# Patient Record
Sex: Male | Born: 1950
Health system: Southern US, Community
[De-identification: ages and names within clinical notes are randomized; demographics above are authoritative.]

## PROBLEM LIST (undated history)

## (undated) DIAGNOSIS — K5190 Ulcerative colitis, unspecified, without complications: Secondary | ICD-10-CM

## (undated) DIAGNOSIS — F329 Major depressive disorder, single episode, unspecified: Secondary | ICD-10-CM

## (undated) DIAGNOSIS — I10 Essential (primary) hypertension: Secondary | ICD-10-CM

## (undated) DIAGNOSIS — F32A Depression, unspecified: Secondary | ICD-10-CM

## (undated) DIAGNOSIS — I480 Paroxysmal atrial fibrillation: Secondary | ICD-10-CM

## (undated) DIAGNOSIS — E785 Hyperlipidemia, unspecified: Secondary | ICD-10-CM

## (undated) HISTORY — DX: Paroxysmal atrial fibrillation: I48.0

## (undated) HISTORY — DX: Depression, unspecified: F32.A

## (undated) HISTORY — DX: Major depressive disorder, single episode, unspecified: F32.9

## (undated) HISTORY — DX: Hyperlipidemia, unspecified: E78.5

## (undated) HISTORY — DX: Ulcerative colitis, unspecified, without complications: K51.90

## (undated) NOTE — *Deleted (*Deleted)
Pt arrives [facility] from home where pt fell a few days ago. Pt son found pt house covered in blood and feces. Pt not cooperative and did not want

---

## 1999-03-27 ENCOUNTER — Ambulatory Visit (HOSPITAL_COMMUNITY): Admission: RE | Admit: 1999-03-27 | Discharge: 1999-03-27 | Payer: Self-pay | Admitting: *Deleted

## 2001-12-09 ENCOUNTER — Ambulatory Visit (HOSPITAL_COMMUNITY): Admission: RE | Admit: 2001-12-09 | Discharge: 2001-12-09 | Payer: Self-pay | Admitting: *Deleted

## 2003-01-06 ENCOUNTER — Ambulatory Visit (HOSPITAL_BASED_OUTPATIENT_CLINIC_OR_DEPARTMENT_OTHER): Admission: RE | Admit: 2003-01-06 | Discharge: 2003-01-06 | Payer: Self-pay | Admitting: *Deleted

## 2008-02-17 ENCOUNTER — Ambulatory Visit: Payer: Self-pay | Admitting: Internal Medicine

## 2008-11-30 ENCOUNTER — Ambulatory Visit: Payer: Self-pay | Admitting: Otolaryngology

## 2008-11-30 ENCOUNTER — Ambulatory Visit: Payer: Self-pay | Admitting: Internal Medicine

## 2009-08-24 ENCOUNTER — Ambulatory Visit: Payer: Self-pay | Admitting: Internal Medicine

## 2009-08-28 ENCOUNTER — Observation Stay: Payer: Self-pay | Admitting: Internal Medicine

## 2011-03-15 ENCOUNTER — Other Ambulatory Visit: Payer: Self-pay | Admitting: Internal Medicine

## 2011-03-15 DIAGNOSIS — J329 Chronic sinusitis, unspecified: Secondary | ICD-10-CM

## 2011-03-15 MED ORDER — AMOXICILLIN-POT CLAVULANATE 875-125 MG PO TABS: 1.0000 | ORAL_TABLET | Freq: Two times a day (BID) | ORAL | Status: AC

## 2011-05-07 ENCOUNTER — Other Ambulatory Visit: Payer: Self-pay | Admitting: Internal Medicine

## 2011-11-26 ENCOUNTER — Telehealth: Payer: Self-pay | Admitting: Internal Medicine

## 2011-11-26 NOTE — Telephone Encounter (Signed)
Patient called and wanted to know if he needs labs done prior to his physical on Thursday.  Please advise.

## 2011-11-26 NOTE — Telephone Encounter (Signed)
Patient notified.  Lab appt scheduled.

## 2011-11-26 NOTE — Telephone Encounter (Signed)
Fasting lipids, CMET, TSH, PSA.

## 2011-11-27 ENCOUNTER — Other Ambulatory Visit (INDEPENDENT_AMBULATORY_CARE_PROVIDER_SITE_OTHER): Payer: BC Managed Care – PPO | Admitting: *Deleted

## 2011-11-27 DIAGNOSIS — Z13220 Encounter for screening for lipoid disorders: Secondary | ICD-10-CM

## 2011-11-27 DIAGNOSIS — Z1329 Encounter for screening for other suspected endocrine disorder: Secondary | ICD-10-CM

## 2011-11-27 DIAGNOSIS — Z125 Encounter for screening for malignant neoplasm of prostate: Secondary | ICD-10-CM

## 2011-11-27 LAB — COMPREHENSIVE METABOLIC PANEL
AST: 20 U/L (ref 0–37)
Albumin: 4.2 g/dL (ref 3.5–5.2)
Alkaline Phosphatase: 51 U/L (ref 39–117)
BUN: 15 mg/dL (ref 6–23)
CO2: 30 mEq/L (ref 19–32)
Calcium: 9 mg/dL (ref 8.4–10.5)
Glucose, Bld: 82 mg/dL (ref 70–99)
Total Protein: 7.3 g/dL (ref 6.0–8.3)

## 2011-11-27 LAB — LIPID PANEL
HDL: 48.9 mg/dL (ref >39.00)
Total CHOL/HDL Ratio: 3
Triglycerides: 133 mg/dL (ref 0.0–149.0)

## 2011-11-28 LAB — PSA, TOTAL AND FREE: PSA: 3.97 ng/mL (ref <=4.00)

## 2011-11-29 ENCOUNTER — Encounter: Payer: Self-pay | Admitting: Internal Medicine

## 2011-11-29 ENCOUNTER — Ambulatory Visit (INDEPENDENT_AMBULATORY_CARE_PROVIDER_SITE_OTHER): Payer: BC Managed Care – PPO | Admitting: Internal Medicine

## 2011-11-29 VITALS — BP 132/74 | HR 68 | Temp 98.9°F | Resp 14 | Ht 68.0 in | Wt 152.5 lb

## 2011-11-29 DIAGNOSIS — F331 Major depressive disorder, recurrent, moderate: Secondary | ICD-10-CM | POA: Insufficient documentation

## 2011-11-29 DIAGNOSIS — I4891 Unspecified atrial fibrillation: Secondary | ICD-10-CM

## 2011-11-29 DIAGNOSIS — Z125 Encounter for screening for malignant neoplasm of prostate: Secondary | ICD-10-CM

## 2011-11-29 DIAGNOSIS — F17200 Nicotine dependence, unspecified, uncomplicated: Secondary | ICD-10-CM

## 2011-11-29 DIAGNOSIS — G4700 Insomnia, unspecified: Secondary | ICD-10-CM | POA: Insufficient documentation

## 2011-11-29 DIAGNOSIS — Z720 Tobacco use: Secondary | ICD-10-CM

## 2011-11-29 DIAGNOSIS — E785 Hyperlipidemia, unspecified: Secondary | ICD-10-CM

## 2011-11-29 DIAGNOSIS — F329 Major depressive disorder, single episode, unspecified: Secondary | ICD-10-CM

## 2011-11-29 DIAGNOSIS — I480 Paroxysmal atrial fibrillation: Secondary | ICD-10-CM

## 2011-11-29 MED ORDER — ATENOLOL 25 MG PO TABS: 25.0000 mg | ORAL_TABLET | Freq: Every day | ORAL | Status: DC

## 2011-12-02 ENCOUNTER — Encounter: Payer: Self-pay | Admitting: Internal Medicine

## 2011-12-02 DIAGNOSIS — E785 Hyperlipidemia, unspecified: Secondary | ICD-10-CM | POA: Insufficient documentation

## 2011-12-02 DIAGNOSIS — I480 Paroxysmal atrial fibrillation: Secondary | ICD-10-CM | POA: Insufficient documentation

## 2011-12-02 DIAGNOSIS — Z720 Tobacco use: Secondary | ICD-10-CM | POA: Insufficient documentation

## 2011-12-02 DIAGNOSIS — K5190 Ulcerative colitis, unspecified, without complications: Secondary | ICD-10-CM | POA: Insufficient documentation

## 2011-12-02 DIAGNOSIS — Z125 Encounter for screening for malignant neoplasm of prostate: Secondary | ICD-10-CM | POA: Insufficient documentation

## 2011-12-02 NOTE — Assessment & Plan Note (Signed)
Prostate exam was deferred.  PSA was checked and < 4.0

## 2011-12-02 NOTE — Assessment & Plan Note (Signed)
Risks of continued tobacco use were discussed. He is not currently interested in tobacco cessation.  

## 2011-12-02 NOTE — Assessment & Plan Note (Signed)
Recommended low dose once daily atenolol for a trial

## 2011-12-02 NOTE — Addendum Note (Signed)
Addended by: Duncan Dull on: 12/02/2011 06:14 PM   Modules accepted: Level of Service

## 2011-12-02 NOTE — Progress Notes (Signed)
Subjective:     Patient ID: Shane Petersen, male   DOB: 01/14/1951, 61 y.o.   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  He has returned from working in [place] for the last year and has lost about 15 lbs unintentionally which he attributes to the emotional stress of the relocation and the divorce his wife initiated while he was gone. He is not sleeping well despite using lorazepam, and continues to have episodes of palpitations which in the past have been attributed to atrial fibrillation.  However he had an ECHO and EKG done in [place] which were normal. His has normal exercise tolerance, remains physically active and denies chest pain , abdominal pain, night sweats and persistent cough.   Review of Systems  Constitutional: Positive for unexpected weight change. Negative for fever, chills, diaphoresis, activity change, appetite change and fatigue.  HENT: Negative.  Negative for hearing loss, ear pain, nosebleeds, congestion, sore throat, facial swelling, rhinorrhea, sneezing, drooling, mouth sores, trouble swallowing, neck pain, neck stiffness, dental problem, voice change, postnasal drip, sinus pressure, tinnitus and ear discharge.   Eyes: Negative.  Negative for photophobia, pain, discharge, redness, itching and visual disturbance.  Respiratory: Negative.  Negative for apnea, cough, choking, chest tightness, shortness of breath, wheezing and stridor.   Cardiovascular: Positive for palpitations. Negative for chest pain and leg swelling.  Gastrointestinal: Negative.  Negative for nausea, vomiting, abdominal pain, diarrhea, constipation, blood in stool, abdominal distention, anal bleeding and rectal pain.  Genitourinary: Negative.  Negative for dysuria, urgency, frequency, hematuria, flank pain, decreased urine volume, scrotal swelling, difficulty urinating and testicular pain.  Musculoskeletal: Negative.  Negative for myalgias,  back pain, joint swelling, arthralgias and gait problem.  Skin: Negative.  Negative for color change, rash and wound.  Neurological: Negative.  Negative for dizziness, tremors, seizures, syncope, speech difficulty, weakness, light-headedness, numbness and headaches.  Hematological: Negative.   Psychiatric/Behavioral: Positive for dysphoric mood. Negative for suicidal ideas, hallucinations, behavioral problems, confusion, disturbed wake/sleep cycle, decreased concentration and agitation. The patient is nervous/anxious.        Objective:   Physical Exam  Constitutional: He is oriented to person, place, and time.  HENT:  Head: Normocephalic and atraumatic.  Mouth/Throat: Oropharynx is clear and moist.  Eyes: Conjunctivae and EOM are normal.  Neck: Normal range of motion. Neck supple. No JVD present. No thyromegaly present.  Cardiovascular: Normal rate, regular rhythm and normal heart sounds.   Pulmonary/Chest: Effort normal and breath sounds normal. He has no wheezes. He has no rales.  Abdominal: Soft. Bowel sounds are normal. He exhibits no mass. There is no tenderness. There is no rebound.  Genitourinary:       deferred  Musculoskeletal: Normal range of motion. He exhibits no edema.  Neurological: He is alert and oriented to person, place, and time.  Skin: Skin is warm and dry.  Psychiatric: He has a normal mood and affect.       Insomnia continue lorazepam and sonata for now.  We discussed trial of SSRI for anxiety vs bedtime trazodone but he would like to wait until he discusses with Dr. Manson Passey.   Episodic atrial fibrillation Recommended low dose once daily atenolol for a trial   Other and unspecified hyperlipidemia Well controlled on current medications.  LDL is < 70 and LFTS are normal. No changes today.  Depression He has had prior trials of Effexor and lexapro which he  did not tolerate well due to various side effects.  He has concurrent anxiety, making Paxil and appropriate  choice, but patient defers to Dr. Manson Passey.   Tobacco abuse, episodic Risks of continued tobacco use were discussed. He is not currently interested in tobacco cessation.    Updated Medication List Outpatient Encounter Prescriptions as of 11/29/2011  Medication Sig Dispense Refill  . atorvastatin (LIPITOR) 10 MG tablet Take 10 mg by mouth daily.      Marland Kitchen LORazepam (ATIVAN) 0.5 MG tablet Take 0.5 mg by mouth every 8 (eight) hours.      . Multiple Vitamin (MULTIVITAMIN) tablet Take 1 tablet by mouth daily.      . ranitidine (ZANTAC) 150 MG tablet Take 150 mg by mouth 2 (two) times daily.      . zaleplon (SONATA) 10 MG capsule Take 10 mg by mouth at bedtime.      Marland Kitchen atenolol (TENORMIN) 25 MG tablet Take 1 tablet (25 mg total) by mouth daily.  30 tablet  3

## 2011-12-02 NOTE — Assessment & Plan Note (Signed)
He has had prior trials of Effexor and lexapro which he did not tolerate well due to various side effects.  He has concurrent anxiety, making Paxil and appropriate choice, but patient defers to Dr. Manson Passey.

## 2011-12-02 NOTE — Assessment & Plan Note (Signed)
Well controlled on current medications.  LDL is < 70 and LFTS are normal. No changes today.

## 2011-12-02 NOTE — Assessment & Plan Note (Addendum)
continue lorazepam and sonata for now.  We discussed trial of SSRI for anxiety vs bedtime trazodone but he would like to wait until he discusses with Dr. Manson Passey.

## 2012-01-08 ENCOUNTER — Other Ambulatory Visit: Payer: Self-pay | Admitting: Internal Medicine

## 2012-01-09 MED ORDER — LORAZEPAM 0.5 MG PO TABS: 0.5000 mg | ORAL_TABLET | Freq: Three times a day (TID) | ORAL | Status: DC

## 2012-01-09 MED ORDER — ATENOLOL 25 MG PO TABS: 25.0000 mg | ORAL_TABLET | Freq: Every day | ORAL | Status: DC

## 2012-01-09 MED ORDER — ZALEPLON 10 MG PO CAPS: 10.0000 mg | ORAL_CAPSULE | Freq: Every day | ORAL | Status: DC

## 2012-01-17 ENCOUNTER — Other Ambulatory Visit: Payer: Self-pay | Admitting: Internal Medicine

## 2012-01-17 MED ORDER — ZALEPLON 10 MG PO CAPS: 10.0000 mg | ORAL_CAPSULE | Freq: Every day | ORAL | Status: DC

## 2012-01-17 MED ORDER — ATENOLOL 25 MG PO TABS: 25.0000 mg | ORAL_TABLET | Freq: Every day | ORAL | Status: DC

## 2012-01-28 ENCOUNTER — Other Ambulatory Visit: Payer: Self-pay | Admitting: Internal Medicine

## 2012-01-28 MED ORDER — DOXYCYCLINE HYCLATE 100 MG PO TABS: 100.0000 mg | ORAL_TABLET | Freq: Two times a day (BID) | ORAL | Status: AC

## 2012-03-31 ENCOUNTER — Other Ambulatory Visit: Payer: Self-pay | Admitting: *Deleted

## 2012-03-31 MED ORDER — ATORVASTATIN CALCIUM 10 MG PO TABS: 10.0000 mg | ORAL_TABLET | Freq: Every day | ORAL | Status: DC

## 2012-03-31 NOTE — Telephone Encounter (Signed)
R'cd fax from [facility] for refill of generic Lipitor.

## 2012-05-14 ENCOUNTER — Other Ambulatory Visit: Payer: Self-pay | Admitting: Internal Medicine

## 2012-05-14 MED ORDER — ATENOLOL 25 MG PO TABS: 25.0000 mg | ORAL_TABLET | Freq: Two times a day (BID) | ORAL | Status: DC

## 2013-01-27 ENCOUNTER — Telehealth: Payer: Self-pay | Admitting: *Deleted

## 2013-01-27 NOTE — Telephone Encounter (Signed)
Refill Request  Fluticasone 50 mcg/Inh mose AER   #16  Use one to two sprays in each nostril once a day

## 2013-01-28 MED ORDER — FLUTICASONE PROPIONATE 50 MCG/ACT NA SUSP: 2.0000 | Freq: Every day | NASAL | Status: DC

## 2013-01-28 NOTE — Telephone Encounter (Signed)
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

## 2013-01-28 NOTE — Telephone Encounter (Signed)
Patient has not had OV 6/13 please advise as to refill on medication I see no reference to patient being on this medication.

## 2013-01-28 NOTE — Addendum Note (Signed)
Addended by: Sherlene Shams on: 01/28/2013 05:00 PM   Modules accepted: Orders

## 2013-03-16 ENCOUNTER — Other Ambulatory Visit: Payer: Self-pay | Admitting: Internal Medicine

## 2013-03-18 ENCOUNTER — Other Ambulatory Visit: Payer: Self-pay | Admitting: *Deleted

## 2013-03-18 NOTE — Telephone Encounter (Signed)
Refill denied.  Please ask Dr Barnabas Lister to come by for fasting labs asap .  He has not had labs since June 2013.

## 2013-03-23 ENCOUNTER — Other Ambulatory Visit: Payer: Self-pay | Admitting: *Deleted

## 2013-03-25 ENCOUNTER — Other Ambulatory Visit: Payer: Self-pay | Admitting: *Deleted

## 2013-03-26 MED ORDER — ATORVASTATIN CALCIUM 10 MG PO TABS: 10.0000 mg | ORAL_TABLET | Freq: Every day | ORAL | Status: DC

## 2013-03-26 NOTE — Telephone Encounter (Signed)
Eprescribed.

## 2013-06-12 ENCOUNTER — Other Ambulatory Visit: Payer: Self-pay | Admitting: Internal Medicine

## 2013-06-15 NOTE — Telephone Encounter (Signed)
Patient has not been seen by you since 11/29/2011. Requesting refill on medication (Atenolol).

## 2013-06-16 NOTE — Telephone Encounter (Signed)
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

## 2013-07-25 ENCOUNTER — Other Ambulatory Visit: Payer: Self-pay | Admitting: Internal Medicine

## 2013-07-25 NOTE — Telephone Encounter (Signed)
Okay to refill? Last seen in June 2013.

## 2013-07-27 NOTE — Telephone Encounter (Signed)
He has not had lfts in over a year so i cannot refill it,. 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

## 2013-07-30 ENCOUNTER — Other Ambulatory Visit: Payer: Self-pay | Admitting: *Deleted

## 2013-07-31 ENCOUNTER — Other Ambulatory Visit: Payer: Self-pay | Admitting: *Deleted

## 2013-07-31 NOTE — Telephone Encounter (Signed)
Patient stated he is having insurance problems and cannot come in for a couple of months and would like to know if you will continue to fill until he can get insurance problems corrected.

## 2013-07-31 NOTE — Telephone Encounter (Signed)
Ok to refill,  Authorized in epic 

## 2013-08-20 ENCOUNTER — Other Ambulatory Visit: Payer: Self-pay | Admitting: Internal Medicine

## 2013-08-20 NOTE — Telephone Encounter (Signed)
Appt 09/18/13 

## 2013-08-28 ENCOUNTER — Encounter: Payer: BC Managed Care – PPO | Admitting: Internal Medicine

## 2013-08-31 ENCOUNTER — Other Ambulatory Visit: Payer: Self-pay | Admitting: Internal Medicine

## 2013-08-31 NOTE — Telephone Encounter (Signed)
Has an appt on April 10th... Okay to refill?

## 2013-09-18 ENCOUNTER — Encounter: Payer: Self-pay | Admitting: Internal Medicine

## 2013-09-18 ENCOUNTER — Ambulatory Visit (INDEPENDENT_AMBULATORY_CARE_PROVIDER_SITE_OTHER): Payer: BC Managed Care – PPO | Admitting: Internal Medicine

## 2013-09-18 VITALS — BP 138/76 | HR 88 | Temp 98.7°F | Resp 16 | Ht 69.5 in | Wt 180.5 lb

## 2013-09-18 DIAGNOSIS — Z2911 Encounter for prophylactic immunotherapy for respiratory syncytial virus (RSV): Secondary | ICD-10-CM

## 2013-09-18 DIAGNOSIS — R5383 Other fatigue: Secondary | ICD-10-CM

## 2013-09-18 DIAGNOSIS — R5381 Other malaise: Secondary | ICD-10-CM

## 2013-09-18 DIAGNOSIS — F329 Major depressive disorder, single episode, unspecified: Secondary | ICD-10-CM

## 2013-09-18 DIAGNOSIS — I480 Paroxysmal atrial fibrillation: Secondary | ICD-10-CM

## 2013-09-18 DIAGNOSIS — E785 Hyperlipidemia, unspecified: Secondary | ICD-10-CM

## 2013-09-18 DIAGNOSIS — Z113 Encounter for screening for infections with a predominantly sexual mode of transmission: Secondary | ICD-10-CM

## 2013-09-18 DIAGNOSIS — Z23 Encounter for immunization: Secondary | ICD-10-CM

## 2013-09-18 DIAGNOSIS — F32A Depression, unspecified: Secondary | ICD-10-CM

## 2013-09-18 DIAGNOSIS — Z0000 Encounter for general adult medical examination without abnormal findings: Secondary | ICD-10-CM

## 2013-09-18 DIAGNOSIS — Z125 Encounter for screening for malignant neoplasm of prostate: Secondary | ICD-10-CM

## 2013-09-18 DIAGNOSIS — F3289 Other specified depressive episodes: Secondary | ICD-10-CM

## 2013-09-18 DIAGNOSIS — K5190 Ulcerative colitis, unspecified, without complications: Secondary | ICD-10-CM

## 2013-09-18 DIAGNOSIS — I4891 Unspecified atrial fibrillation: Secondary | ICD-10-CM

## 2013-09-18 LAB — CBC WITH DIFFERENTIAL/PLATELET
Basophils Absolute: 0.1 10*3/uL (ref 0.0–0.1)
Basophils Relative: 1 % (ref 0–1)
Eosinophils Absolute: 0.2 10*3/uL (ref 0.0–0.7)
Eosinophils Relative: 2 % (ref 0–5)
HEMATOCRIT: 44.7 % (ref 39.0–52.0)
HEMOGLOBIN: 15.1 g/dL (ref 13.0–17.0)
Lymphocytes Relative: 23 % (ref 12–46)
Lymphs Abs: 2 10*3/uL (ref 0.7–4.0)
MCH: 30.8 pg (ref 26.0–34.0)
MCHC: 33.8 g/dL (ref 30.0–36.0)
MCV: 91.2 fL (ref 78.0–100.0)
MONOS PCT: 11 % (ref 3–12)
Monocytes Absolute: 1 10*3/uL (ref 0.1–1.0)
NEUTROS ABS: 5.5 10*3/uL (ref 1.7–7.7)
Neutrophils Relative %: 63 % (ref 43–77)
Platelets: 259 10*3/uL (ref 150–400)
RBC: 4.9 MIL/uL (ref 4.22–5.81)
RDW: 13.6 % (ref 11.5–15.5)
WBC: 8.8 10*3/uL (ref 4.0–10.5)

## 2013-09-18 LAB — COMPREHENSIVE METABOLIC PANEL
ALT: 35 U/L (ref 0–53)
AST: 25 U/L (ref 0–37)
Albumin: 4.2 g/dL (ref 3.5–5.2)
Alkaline Phosphatase: 53 U/L (ref 39–117)
BILIRUBIN TOTAL: 0.6 mg/dL (ref 0.2–1.2)
BUN: 11 mg/dL (ref 6–23)
CHLORIDE: 100 meq/L (ref 96–112)
CO2: 30 meq/L (ref 19–32)
Calcium: 9.6 mg/dL (ref 8.4–10.5)
Creat: 1.04 mg/dL (ref 0.50–1.35)
GLUCOSE: 92 mg/dL (ref 70–99)
POTASSIUM: 4.4 meq/L (ref 3.5–5.3)
Sodium: 140 mEq/L (ref 135–145)
Total Protein: 6.8 g/dL (ref 6.0–8.3)

## 2013-09-18 LAB — LDL CHOLESTEROL, DIRECT: LDL DIRECT: 98 mg/dL

## 2013-09-18 MED ORDER — AZELASTINE-FLUTICASONE 137-50 MCG/ACT NA SUSP: NASAL | Status: DC

## 2013-09-18 MED ORDER — ATENOLOL 25 MG PO TABS: 75.0000 mg | ORAL_TABLET | Freq: Every day | ORAL | Status: DC

## 2013-09-18 MED ORDER — SILDENAFIL CITRATE 50 MG PO TABS: 50.0000 mg | ORAL_TABLET | Freq: Every day | ORAL | Status: DC | PRN

## 2013-09-18 NOTE — Patient Instructions (Signed)
It was good to see you Shane Petersen  Your labs should be available in a few days on Mychart ,  I'll review them and send them to you..Marland Kitchen

## 2013-09-18 NOTE — Progress Notes (Signed)
Patient ID: Shane Petersen, male   DOB: Sep 14, 1950, 63 y.o.   MRN: [id number]  The patient is here for annual  wellness examination and management of other chronic and acute problems.  Annual non urologic exam.  Last seen 2012 .  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  During his absence he suffered  from severe depression, divorce and bankruptcy.  He is seeing a long time trusted psychiatrist and is currently stable ,  Not suicidal .  Has begun seeing a woma he met on a walking trail and enjoying the companionship.  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  Miserable in his current arrangement but compelled to continue current arrangement until something else is vailable.  Has gained 3 lbs since last visit.     The risk factors are reflected in the social history.  The roster of all physicians providing medical care to patient - is listed in the Snapshot section of the chart.  Activities of daily living:  The patient is 100% independent in all ADLs: dressing, toileting, feeding as well as independent mobility  Home safety : The patient has smoke detectors in the home. They wear seatbelts.  There are no firearms at home. There is no violence in the home.   There is no risks for hepatitis, STDs or HIV. There is no   history of blood transfusion. They have no travel history to infectious disease endemic areas of the world.  The patient has seen their dentist in the last six month. They have seen their eye doctor in the last year. They admit to slight hearing difficulty with regard to whispered voices and some television programs.  They have deferred audiologic testing in the last year.  They do not  have excessive sun exposure. Discussed the need for sun protection: hats, long sleeves and use of sunscreen if there is significant sun exposure.   Diet: the importance of a healthy diet is discussed. They do have a  healthy diet.  The benefits of regular aerobic exercise were discussed. She walks 4 times per week ,  20 minutes.    The following portions of the patient's history were reviewed and updated as appropriate: allergies, current medications, past family history, past medical history,  past surgical history, past social history  and problem list.  Visual acuity was not assessed per patient preference since she has regular follow up with her ophthalmologist. Hearing and body mass index were assessed and reviewed.   During the course of the visit the patient was educated and counseled about appropriate screening and preventive services including : fall prevention , diabetes screening, nutrition counseling, colorectal cancer screening, and recommended immunizations.    Objective:   BP 138/76  Pulse 88  Temp(Src) 98.7 F (37.1 C) (Oral)  Resp 16  Ht 5' 9.5" (1.765 m)  Wt 180 lb 8 oz (81.874 kg)  BMI 26.28 kg/m2  SpO2 98%  General Appearance:    Alert, cooperative, no distress, appears stated age  Head:    Normocephalic, without obvious abnormality, atraumatic  Eyes:    PERRL, conjunctiva/corneas clear, EOM's intact, fundi    benign, both eyes       Ears:    Normal TM's and external ear canals, both ears  Nose:   Nares normal, septum midline, mucosa normal, no drainage   or sinus tenderness  Throat:   Lips, mucosa, and tongue  normal; teeth and gums normal  Neck:   Supple, symmetrical, trachea midline, no adenopathy;       thyroid:  No enlargement/tenderness/nodules; no carotid   bruit or JVD  Back:     Symmetric, no curvature, ROM normal, no CVA tenderness  Lungs:     Clear to auscultation bilaterally, respirations unlabored  Chest wall:    No tenderness or deformity  Heart:    Regular rate and rhythm, S1 and S2 normal, no murmur, rub   or gallop  Abdomen:     Soft, non-tender, bowel sounds active all four quadrants,    no masses, no organomegaly  Extremities:   Extremities normal,  atraumatic, no cyanosis or edema  Pulses:   2+ and symmetric all extremities  Skin:   Skin color, texture, turgor normal, tatooes noted on left upper arm   Lymph nodes:   Cervical, supraclavicular, and axillary nodes normal  Neurologic:   CNII-XII intact. Normal strength, sensation and reflexes      throughout    Assessment and plan:  Depression Currently stable,  Managed by psychiatris tin [place]   Paroxysmal atrial fibrillation continue atenolol bid.   Special screening for malignant neoplasm of prostate PSA without prostate exam done today   Ulcerative colitis He is overdue for colonoscopy follow up but has deferred for now   Other and unspecified hyperlipidemia Tolerating statin therapy.   Liver enzymes are normal , no changes today.  Lab Results  Component Value Date   CHOL 135 11/27/2011   HDL 48.90 11/27/2011   LDLCALC 60 11/27/2011   LDLDIRECT 98 09/18/2013   TRIG 133.0 11/27/2011   CHOLHDL 3 11/27/2011   Lab Results  Component Value Date   ALT 35 09/18/2013   AST 25 09/18/2013   ALKPHOS 53 09/18/2013   BILITOT 0.6 09/18/2013     Encounter for preventive health examination Annual male exam was done excluding testicular and prostate exam. PSA is pending .  Colon ca screening was reviewed and he has deferred screening for now.  Lab Results  Component Value Date   PSA 1.15 09/18/2013   PSA 3.97 11/27/2011        Updated Medication List Outpatient Encounter Prescriptions as of 09/18/2013  Medication Sig  . ARIPiprazole (ABILIFY) 2 MG tablet Take 2 mg by mouth daily.  Marland Kitchen aspirin 81 MG tablet Take 81 mg by mouth daily.  Marland Kitchen atenolol (TENORMIN) 25 MG tablet Take 3 tablets (75 mg total) by mouth daily.  Marland Kitchen atorvastatin (LIPITOR) 20 MG tablet TAKE 1 TABLET EVERY DAY  . Eszopiclone 3 MG TABS Take 3 mg by mouth at bedtime and may repeat dose one time if needed.  . fluticasone (FLONASE) 50 MCG/ACT nasal spray Place 2 sprays into the nose daily.  . Melatonin 3 MG CAPS  Take 1 capsule by mouth at bedtime and may repeat dose one time if needed.  . mirtazapine (REMERON) 15 MG tablet Take 15 mg by mouth daily.  . Multiple Vitamin (MULTIVITAMIN) tablet Take 1 tablet by mouth daily.  . zaleplon (SONATA) 10 MG capsule Take 1 capsule (10 mg total) by mouth at bedtime.  . [DISCONTINUED] atenolol (TENORMIN) 25 MG tablet Take 75 mg by mouth daily.  Marland Kitchen atorvastatin (LIPITOR) 10 MG tablet Take 20 mg by mouth daily.  . Azelastine-Fluticasone (DYMISTA) 137-50 MCG/ACT SUSP 2 squirts In each nostril daily  . sildenafil (VIAGRA) 50 MG tablet Take 1 tablet (50 mg total) by mouth daily as needed for erectile dysfunction.  . [  DISCONTINUED] atenolol (TENORMIN) 25 MG tablet Take 1.5 tablets (37.5 mg total) by mouth 2 (two) times daily.  . [DISCONTINUED] atorvastatin (LIPITOR) 10 MG tablet Take 1 tablet (10 mg total) by mouth daily.  . [DISCONTINUED] LORazepam (ATIVAN) 0.5 MG tablet Take 1 tablet (0.5 mg total) by mouth every 8 (eight) hours.  . [DISCONTINUED] ranitidine (ZANTAC) 150 MG tablet Take 150 mg by mouth 2 (two) times daily.

## 2013-09-19 LAB — PSA: PSA: 1.15 ng/mL (ref <=4.00)

## 2013-09-19 LAB — HEPATITIS C ANTIBODY: HCV Ab: NEGATIVE

## 2013-09-19 LAB — TSH: TSH: 0.792 u[IU]/mL (ref 0.350–4.500)

## 2013-09-20 DIAGNOSIS — Z0000 Encounter for general adult medical examination without abnormal findings: Secondary | ICD-10-CM | POA: Insufficient documentation

## 2013-09-20 NOTE — Assessment & Plan Note (Signed)
Tolerating statin therapy.   Liver enzymes are normal , no changes today.  Lab Results  Component Value Date   CHOL 135 11/27/2011   HDL 48.90 11/27/2011   LDLCALC 60 11/27/2011   LDLDIRECT 98 09/18/2013   TRIG 133.0 11/27/2011   CHOLHDL 3 11/27/2011   Lab Results  Component Value Date   ALT 35 09/18/2013   AST 25 09/18/2013   ALKPHOS 53 09/18/2013   BILITOT 0.6 09/18/2013

## 2013-09-20 NOTE — Assessment & Plan Note (Signed)
PSA without prostate exam done today

## 2013-09-20 NOTE — Assessment & Plan Note (Signed)
Annual male exam was done excluding testicular and prostate exam. PSA is pending .  Colon ca screening was reviewed and he has deferred screening for now.  Lab Results  Component Value Date   PSA 1.15 09/18/2013   PSA 3.97 11/27/2011

## 2013-09-20 NOTE — Assessment & Plan Note (Signed)
continue atenolol bid.

## 2013-09-20 NOTE — Assessment & Plan Note (Signed)
Currently stable,  [facility]

## 2013-09-20 NOTE — Assessment & Plan Note (Signed)
He is overdue for colonoscopy follow up but has deferred for now

## 2013-09-21 ENCOUNTER — Telehealth: Payer: Self-pay | Admitting: Internal Medicine

## 2013-09-21 ENCOUNTER — Encounter: Payer: Self-pay | Admitting: *Deleted

## 2013-09-21 NOTE — Telephone Encounter (Signed)
Relevant patient education mailed to patient.  

## 2013-10-16 ENCOUNTER — Other Ambulatory Visit: Payer: Self-pay | Admitting: Internal Medicine

## 2014-01-29 ENCOUNTER — Telehealth: Payer: Self-pay

## 2014-01-29 DIAGNOSIS — Z135 Encounter for screening for eye and ear disorders: Secondary | ICD-10-CM

## 2014-01-29 DIAGNOSIS — H524 Presbyopia: Secondary | ICD-10-CM

## 2014-01-29 NOTE — Telephone Encounter (Signed)
The patient is hoping to obtain a referral for Shane Petersen for his yearly Petersen exam.

## 2014-01-29 NOTE — Telephone Encounter (Signed)
Referral is in process as requested for Dr Barnabas [facility]ister[place] to [facility]

## 2014-04-09 ENCOUNTER — Other Ambulatory Visit: Payer: Self-pay | Admitting: Internal Medicine

## 2014-04-19 ENCOUNTER — Other Ambulatory Visit: Payer: Self-pay | Admitting: Internal Medicine

## 2014-08-20 ENCOUNTER — Other Ambulatory Visit: Payer: Self-pay | Admitting: *Deleted

## 2014-08-20 ENCOUNTER — Other Ambulatory Visit: Payer: Self-pay | Admitting: Internal Medicine

## 2014-08-20 MED ORDER — ATENOLOL 25 MG PO TABS: ORAL_TABLET | ORAL | Status: DC

## 2014-09-24 ENCOUNTER — Ambulatory Visit (INDEPENDENT_AMBULATORY_CARE_PROVIDER_SITE_OTHER): Payer: Commercial Managed Care - HMO | Admitting: Internal Medicine

## 2014-09-24 ENCOUNTER — Encounter: Payer: Self-pay | Admitting: Internal Medicine

## 2014-09-24 VITALS — BP 120/80 | HR 59 | Temp 98.9°F | Ht 68.8 in | Wt 172.8 lb

## 2014-09-24 DIAGNOSIS — R5383 Other fatigue: Secondary | ICD-10-CM

## 2014-09-24 DIAGNOSIS — E559 Vitamin D deficiency, unspecified: Secondary | ICD-10-CM

## 2014-09-24 DIAGNOSIS — F329 Major depressive disorder, single episode, unspecified: Secondary | ICD-10-CM

## 2014-09-24 DIAGNOSIS — I480 Paroxysmal atrial fibrillation: Secondary | ICD-10-CM

## 2014-09-24 DIAGNOSIS — Z125 Encounter for screening for malignant neoplasm of prostate: Secondary | ICD-10-CM

## 2014-09-24 DIAGNOSIS — E785 Hyperlipidemia, unspecified: Secondary | ICD-10-CM

## 2014-09-24 DIAGNOSIS — Z0000 Encounter for general adult medical examination without abnormal findings: Secondary | ICD-10-CM | POA: Diagnosis not present

## 2014-09-24 DIAGNOSIS — F32A Depression, unspecified: Secondary | ICD-10-CM

## 2014-09-24 LAB — CBC WITH DIFFERENTIAL/PLATELET
BASOS PCT: 0.4 % (ref 0.0–3.0)
Basophils Absolute: 0 10*3/uL (ref 0.0–0.1)
Eosinophils Absolute: 0.1 10*3/uL (ref 0.0–0.7)
Eosinophils Relative: 1.6 % (ref 0.0–5.0)
HCT: 46.8 % (ref 39.0–52.0)
Hemoglobin: 15.9 g/dL (ref 13.0–17.0)
LYMPHS ABS: 1.8 10*3/uL (ref 0.7–4.0)
Lymphocytes Relative: 22.4 % (ref 12.0–46.0)
MCHC: 34 g/dL (ref 30.0–36.0)
MCV: 90.8 fl (ref 78.0–100.0)
MONO ABS: 0.6 10*3/uL (ref 0.1–1.0)
Monocytes Relative: 7.3 % (ref 3.0–12.0)
Neutro Abs: 5.4 10*3/uL (ref 1.4–7.7)
Neutrophils Relative %: 68.3 % (ref 43.0–77.0)
Platelets: 265 10*3/uL (ref 150.0–400.0)
RBC: 5.16 Mil/uL (ref 4.22–5.81)
RDW: 13.5 % (ref 11.5–15.5)
WBC: 7.9 10*3/uL (ref 4.0–10.5)

## 2014-09-24 LAB — LIPID PANEL
CHOL/HDL RATIO: 4
Cholesterol: 178 mg/dL (ref 0–200)
HDL: 40.5 mg/dL (ref >39.00)
NonHDL: 137.5
TRIGLYCERIDES: 297 mg/dL — AB (ref 0.0–149.0)
VLDL: 59.4 mg/dL — ABNORMAL HIGH (ref 0.0–40.0)

## 2014-09-24 LAB — COMPREHENSIVE METABOLIC PANEL
ALBUMIN: 4.2 g/dL (ref 3.5–5.2)
ALT: 25 U/L (ref 0–53)
AST: 20 U/L (ref 0–37)
Alkaline Phosphatase: 53 U/L (ref 39–117)
BUN: 12 mg/dL (ref 6–23)
CALCIUM: 9.8 mg/dL (ref 8.4–10.5)
CHLORIDE: 101 meq/L (ref 96–112)
CO2: 30 mEq/L (ref 19–32)
CREATININE: 0.94 mg/dL (ref 0.40–1.50)
GFR: 85.38 mL/min (ref >60.00)
Glucose, Bld: 118 mg/dL — ABNORMAL HIGH (ref 70–99)
POTASSIUM: 4.1 meq/L (ref 3.5–5.1)
Sodium: 138 mEq/L (ref 135–145)
Total Bilirubin: 0.8 mg/dL (ref 0.2–1.2)
Total Protein: 6.9 g/dL (ref 6.0–8.3)

## 2014-09-24 LAB — TSH: TSH: 0.71 u[IU]/mL (ref 0.35–4.50)

## 2014-09-24 LAB — LDL CHOLESTEROL, DIRECT: Direct LDL: 100 mg/dL

## 2014-09-24 LAB — PSA, MEDICARE: PSA: 1.14 ng/ml (ref 0.10–4.00)

## 2014-09-24 LAB — VITAMIN D 25 HYDROXY (VIT D DEFICIENCY, FRACTURES): VITD: 44.05 ng/mL (ref 30.00–100.00)

## 2014-09-24 NOTE — Progress Notes (Signed)
Patient ID: Shane Petersen, male   DOB: 08/11/1950, 64 y.o.   MRN: [id number]   The patient is here for his annual male preventive examination and management of other chronic and acute problems. He has been [place] in [place] , Dr.Fred Raj JanusIrons, for [employer] Energymanamgement of persistent depression with insomnia ,  Taking abilify,  mirtazipine , lunesta and sonata .     The risk factors are reflected in the social history.  The roster of all physicians providing medical care to patient - is listed in the Snapshot section of the chart.   Home safety : The patient has smoke detectors in the home. He wears seatbelts.  There are registered  firearms at home. There is no violence in the home.   There is no risks for hepatitis, STDs or HIV. There is no   history of blood transfusion. There is no travel history to infectious disease endemic areas of the world.  The patient has seen their dentist in the last six month and  their eye doctor in the last year.  They do not  have excessive sun exposure. They have seen a dermatoloigist in the last year. Discussed the need for sun protection: hats, long sleeves and use of sunscreen if there is significant sun exposure.   Diet: the importance of a healthy diet is discussed. They do have a healthy diet.  The benefits of regular aerobic exercise were discussed. He exercises a minimum of 30 minutes  5 days per week.  Depression screen: there are no signs or vegative symptoms of depression- irritability, change in appetite, anhedonia, sadness/tearfullness.  The following portions of the patient's history were reviewed and updated as appropriate: allergies, current medications, past family history, past medical history,  past surgical history, past social history  and problem list.  Visual acuity was not assessed per patient preference since he has regular follow up with his ophthalmologist. Hearing and body mass index were assessed and reviewed.   During  the course of the visit the patient was educated and counseled about appropriate screening and preventive services including :  nutrition counseling, colorectal cancer screening, and recommended immunizations.    Review of systems:  Patient denies headache, fevers, malaise, unintentional weight loss, skin rash, eye pain, sinus congestion and sinus pain, sore throat, dysphagia,  hemoptysis , cough, dyspnea, wheezing, chest pain, palpitations, orthopnea, edema, abdominal pain, nausea, melena, diarrhea, constipation, flank pain, dysuria, hematuria, urinary  Frequency, nocturia, numbness, tingling, seizures,  Focal weakness, Loss of consciousness,  Tremor, .   Objective:  BP 120/80 mmHg  Pulse 59  Temp(Src) 98.9 F (37.2 C) (Oral)  Ht 5' 8.8" (1.748 m)  Wt 172 lb 12 oz (78.359 kg)  BMI 25.65 kg/m2  SpO2 96%  General appearance: alert, cooperative and appears stated age Ears: normal TM's and external ear canals both ears Throat: lips, mucosa, and tongue normal; teeth and gums normal Neck: no adenopathy, no carotid bruit, supple, symmetrical, trachea midline and thyroid not enlarged, symmetric, no tenderness/mass/nodules Back: symmetric, no curvature. ROM normal. No CVA tenderness. Lungs: clear to auscultation bilaterally Heart: regular rate and rhythm, S1, S2 normal, no murmur, click, rub or gallop Abdomen: soft, non-tender; bowel sounds normal; no masses,  no organomegaly Pulses: 2+ and symmetric Skin: Skin color, texture, turgor normal. No rashes or lesions Lymph nodes: Cervical, supraclavicular, and axillary nodes normal. Psychiatric:  Affect flat , makes good eye contact.  He has occasional Suicidal thoughts but no plan,  His faith  prevents him from acting.   Assessment and Plan:  Problem List Items Addressed This Visit      Unprioritized   Depression    Currently managed by Dr [facility]lee [facility] at [facility].  With mirtazipine,  Abilify,  Sonata and lunesta.  He has had suicidal thoughts,  But  no plan and states that his Ephri[employer] Knuckles faith prevents him from acting on his thoughts.       Hyperlipidemia LDL goal <130    He is not fasting and cannot return for fasting labs. LDL  Is at goal on current medications. He has no side effects and liver enzymes are normal. No changes today   Lab Results  Component Value Date   CHOL 178 09/24/2014   HDL 40.50 09/24/2014   LDLCALC 60 11/27/2011   LDLDIRECT 100.0 09/24/2014   TRIG 297.0* 09/24/2014   CHOLHDL 4 09/24/2014   Lab Results  Component Value Date   ALT 25 09/24/2014   AST 20 09/24/2014   ALKPHOS 53 09/24/2014   BILITOT 0.8 09/24/2014         Paroxysmal atrial fibrillation    He has had no recent episodes      Encounter for preventive health examination    Annual wellness  exam was done as well as a comprehensive physical exam and management of acute and chronic conditions .  During the course of the visit the patient was educated and counseled about appropriate screening and preventive services including :  diabetes screening, lipid analysis with projected  10 year  risk for CAD , nutrition counseling, colorectal cancer screening, and recommended immunizations.  Printed recommendations for health maintenance screenings was given.        Other Visit Diagnoses    Hyperlipidemia    -  Primary    Relevant Orders    Lipid panel (Completed)    LDL cholesterol, direct (Completed)    Vitamin D deficiency        Relevant Orders    Vit D  25 hydroxy (rtn osteoporosis monitoring) (Completed)    Other fatigue        Relevant Orders    CBC with Differential/Platelet (Completed)    Comprehensive metabolic panel (Completed)    TSH (Completed)    Prostate cancer screening        Relevant Orders    PSA, Medicare (Completed)

## 2014-09-24 NOTE — Patient Instructions (Signed)
Shane Petersen,  My hone number is still [phone]  I'll send you your results via MyChart  I'll call you about having dinner some Thursday in May after I [place] [facility]   Health Maintenance A healthy lifestyle and preventative care can promote health and wellness.  Maintain regular health, dental, and eye exams.  Eat a healthy diet. Foods like vegetables, fruits, whole grains, low-fat dairy products, and lean protein foods contain the nutrients you need and are low in calories. Decrease your intake of foods high in solid fats, added sugars, and salt. Get information about a proper diet from your health care provider, if necessary.  Regular physical exercise is one of the most important things you can do for your health. Most adults should get at least 150 minutes of moderate-intensity exercise (any activity that increases your heart rate and causes you to sweat) each week. In addition, most adults need muscle-strengthening exercises on 2 or more days a week.   Maintain a healthy weight. The body mass index (BMI) is a screening tool to identify possible weight problems. It provides an estimate of body fat based on height and weight. Your health care provider can find your BMI and can help you achieve or maintain a healthy weight. For males 20 years and older:  A BMI below 18.5 is considered underweight.  A BMI of 18.5 to 24.9 is normal.  A BMI of 25 to 29.9 is considered overweight.  A BMI of 30 and above is considered obese.  Maintain normal blood lipids and cholesterol by exercising and minimizing your intake of saturated fat. Eat a balanced diet with plenty of fruits and vegetables. Blood tests for lipids and cholesterol should begin at age 66 and be repeated every 5 years. If your lipid or cholesterol levels are high, you are over age 66, or you are at high risk for heart disease, you may need your cholesterol levels checked more frequently.Ongoing high lipid and cholesterol  levels should be treated with medicines if diet and exercise are not working.  If you smoke, find out from your health care provider how to quit. If you do not use tobacco, do not start.  Lung cancer screening is recommended for adults aged 55-80 years who are at high risk for developing lung cancer because of a history of smoking. A yearly low-dose CT scan of the lungs is recommended for people who have at least a 30-pack-year history of smoking and are current smokers or have quit within the past 15 years. A pack year of smoking is smoking an average of 1 pack of cigarettes a day for 1 year (for example, a 30-pack-year history of smoking could mean smoking 1 pack a day for 30 years or 2 packs a day for 15 years). Yearly screening should continue until the smoker has stopped smoking for at least 15 years. Yearly screening should be stopped for people who develop a health problem that would prevent them from having lung cancer treatment.  If you choose to drink alcohol, do not have more than 2 drinks per day. One drink is considered to be 12 oz (360 mL) of beer, 5 oz (150 mL) of wine, or 1.5 oz (45 mL) of liquor.  Avoid the use of street drugs. Do not share needles with anyone. Ask for help if you need support or instructions about stopping the use of drugs.  High blood pressure causes heart disease and increases the risk of stroke.  Blood pressure should be checked at least every 1-2 years. Ongoing high blood pressure should be treated with medicines if weight loss and exercise are not effective.  If you are 46-35 years old, ask your health care provider if you should take aspirin to prevent heart disease.  Diabetes screening involves taking a blood sample to check your fasting blood sugar level. This should be done once every 3 years after age 59 if you are at a normal weight and without risk factors for diabetes. Testing should be considered at a younger age or be carried out more frequently if you  are overweight and have at least 1 risk factor for diabetes.  Colorectal cancer can be detected and often prevented. Most routine colorectal cancer screening begins at the age of 85 and continues through age 71. However, your health care provider may recommend screening at an earlier age if you have risk factors for colon cancer. On a yearly basis, your health care provider may provide home test kits to check for hidden blood in the stool. A small camera at the end of a tube may be used to directly examine the colon (sigmoidoscopy or colonoscopy) to detect the earliest forms of colorectal cancer. Talk to your health care provider about this at age 68 when routine screening begins. A direct exam of the colon should be repeated every 5-10 years through age 38, unless early forms of precancerous polyps or small growths are found.  People who are at an increased risk for hepatitis B should be screened for this virus. You are considered at high risk for hepatitis B if:  You were born in a country where hepatitis B occurs often. Talk with your health care provider about which countries are considered high risk.  Your parents were born in a high-risk country and you have not received a shot to protect against hepatitis B (hepatitis B vaccine).  You have HIV or AIDS.  You use needles to inject street drugs.  You live with, or have sex with, someone who has hepatitis B.  You are a man who has sex with other men (MSM).  You get hemodialysis treatment.  You take certain medicines for conditions like cancer, organ transplantation, and autoimmune conditions.  Hepatitis C blood testing is recommended for all people born from [birth year] through 1967 and any individual with known risk factors for hepatitis C.  Healthy men should no longer receive prostate-specific antigen (PSA) blood tests as part of routine cancer screening. Talk to your health care provider about prostate cancer screening.  Testicular cancer  screening is not recommended for adolescents or adult males who have no symptoms. Screening includes self-exam, a health care provider exam, and other screening tests. Consult with your health care provider about any symptoms you have or any concerns you have about testicular cancer.  Practice safe sex. Use condoms and avoid high-risk sexual practices to reduce the spread of sexually transmitted infections (STIs).  You should be screened for STIs, including gonorrhea and chlamydia if:  You are sexually active and are younger than 24 years.  You are older than 24 years, and your health care provider tells you that you are at risk for this type of infection.  Your sexual activity has changed since you were last screened, and you are at an increased risk for chlamydia or gonorrhea. Ask your health care provider if you are at risk.  If you are at risk of being infected with HIV, it is recommended that you  take a prescription medicine daily to prevent HIV infection. This is called pre-exposure prophylaxis (PrEP). You are considered at risk if:  You are a man who has sex with other men (MSM).  You are a heterosexual man who is sexually active with multiple partners.  You take drugs by injection.  You are sexually active with a partner who has HIV.  Talk with your health care provider about whether you are at high risk of being infected with HIV. If you choose to begin PrEP, you should first be tested for HIV. You should then be tested every 3 months for as long as you are taking PrEP.  Use sunscreen. Apply sunscreen liberally and repeatedly throughout the day. You should seek shade when your shadow is shorter than you. Protect yourself by wearing long sleeves, pants, a wide-brimmed hat, and sunglasses year round whenever you are outdoors.  Tell your health care provider of new moles or changes in moles, especially if there is a change in shape or color. Also, tell your health care provider if a  mole is larger than the size of a pencil eraser.  A one-time screening for abdominal aortic aneurysm (AAA) and surgical repair of large AAAs by ultrasound is recommended for men aged 29-75 years who are current or former smokers.  Stay current with your vaccines (immunizations). Document Released: 11/24/2007 Document Revised: 06/02/2013 Document Reviewed: 10/23/2010 [facility] Patient Information 2015 [place], [place]. This information is not intended to replace advice given to you by your health care provider. Make sure you discuss any questions you have with your health care provider.

## 2014-09-24 NOTE — Progress Notes (Signed)
Pre visit review using our clinic review tool, if applicable. No additional management support is needed unless otherwise documented below in the visit note. 

## 2014-09-26 NOTE — Assessment & Plan Note (Signed)

## 2014-09-26 NOTE — Assessment & Plan Note (Signed)
Currently managed [facility].  With mirtazipine,  Abilify,  Sonata and lunesta.  He has had suicidal thoughts,  But no plan and states that his Shane Knuckles[employer] faith prevents him from acting on his thoughts.

## 2014-09-26 NOTE — Assessment & Plan Note (Signed)
He is not fasting and cannot return for fasting labs. LDL  Is at goal on current medications. He has no side effects and liver enzymes are normal. No changes today   Lab Results  Component Value Date   CHOL 178 09/24/2014   HDL 40.50 09/24/2014   LDLCALC 60 11/27/2011   LDLDIRECT 100.0 09/24/2014   TRIG 297.0* 09/24/2014   CHOLHDL 4 09/24/2014   Lab Results  Component Value Date   ALT 25 09/24/2014   AST 20 09/24/2014   ALKPHOS 53 09/24/2014   BILITOT 0.8 09/24/2014

## 2014-09-26 NOTE — Assessment & Plan Note (Signed)
He has had no recent episodes

## 2014-09-27 ENCOUNTER — Encounter: Payer: Self-pay | Admitting: *Deleted

## 2014-10-20 ENCOUNTER — Other Ambulatory Visit: Payer: Self-pay | Admitting: Internal Medicine

## 2014-12-24 ENCOUNTER — Other Ambulatory Visit: Payer: Self-pay | Admitting: Internal Medicine

## 2015-04-01 ENCOUNTER — Other Ambulatory Visit: Payer: Self-pay | Admitting: Internal Medicine

## 2015-06-11 ENCOUNTER — Other Ambulatory Visit: Payer: Self-pay | Admitting: Internal Medicine

## 2015-07-12 ENCOUNTER — Other Ambulatory Visit: Payer: Self-pay | Admitting: Internal Medicine

## 2015-07-14 ENCOUNTER — Other Ambulatory Visit: Payer: Self-pay

## 2015-07-14 ENCOUNTER — Telehealth: Payer: Self-pay | Admitting: Internal Medicine

## 2015-07-14 MED ORDER — ATORVASTATIN CALCIUM 10 MG PO TABS: 20.0000 mg | ORAL_TABLET | Freq: Every day | ORAL | Status: DC

## 2015-07-14 NOTE — Telephone Encounter (Signed)
filled

## 2015-07-14 NOTE — Telephone Encounter (Signed)
Lipitor  filled

## 2015-07-14 NOTE — Telephone Encounter (Signed)
Pt called needing a refill for atorvastatin (LIPITOR) 10 MG tablet. Pharmacy is [facility] - [place], [place] - [address] Call pt @ [phone]. Thank you!

## 2015-09-27 ENCOUNTER — Encounter: Payer: Self-pay | Admitting: Internal Medicine

## 2015-09-27 ENCOUNTER — Ambulatory Visit (INDEPENDENT_AMBULATORY_CARE_PROVIDER_SITE_OTHER): Payer: Commercial Managed Care - HMO | Admitting: Internal Medicine

## 2015-09-27 VITALS — BP 122/70 | HR 63 | Temp 98.6°F | Resp 12 | Ht 69.0 in | Wt 168.5 lb

## 2015-09-27 DIAGNOSIS — Z23 Encounter for immunization: Secondary | ICD-10-CM

## 2015-09-27 DIAGNOSIS — R7301 Impaired fasting glucose: Secondary | ICD-10-CM

## 2015-09-27 DIAGNOSIS — D72829 Elevated white blood cell count, unspecified: Secondary | ICD-10-CM

## 2015-09-27 DIAGNOSIS — F329 Major depressive disorder, single episode, unspecified: Secondary | ICD-10-CM

## 2015-09-27 DIAGNOSIS — H524 Presbyopia: Secondary | ICD-10-CM

## 2015-09-27 DIAGNOSIS — E559 Vitamin D deficiency, unspecified: Secondary | ICD-10-CM

## 2015-09-27 DIAGNOSIS — Z0001 Encounter for general adult medical examination with abnormal findings: Secondary | ICD-10-CM

## 2015-09-27 DIAGNOSIS — F32A Depression, unspecified: Secondary | ICD-10-CM

## 2015-09-27 DIAGNOSIS — R5383 Other fatigue: Secondary | ICD-10-CM

## 2015-09-27 DIAGNOSIS — I10 Essential (primary) hypertension: Secondary | ICD-10-CM | POA: Diagnosis not present

## 2015-09-27 DIAGNOSIS — Z0000 Encounter for general adult medical examination without abnormal findings: Secondary | ICD-10-CM

## 2015-09-27 LAB — CBC WITH DIFFERENTIAL/PLATELET
BASOS ABS: 0 10*3/uL (ref 0.0–0.1)
BASOS PCT: 0.4 % (ref 0.0–3.0)
Eosinophils Absolute: 0.1 10*3/uL (ref 0.0–0.7)
Eosinophils Relative: 1.1 % (ref 0.0–5.0)
HEMATOCRIT: 46.7 % (ref 39.0–52.0)
Hemoglobin: 15.8 g/dL (ref 13.0–17.0)
LYMPHS ABS: 1.7 10*3/uL (ref 0.7–4.0)
LYMPHS PCT: 14.6 % (ref 12.0–46.0)
MCHC: 33.8 g/dL (ref 30.0–36.0)
MCV: 92.2 fl (ref 78.0–100.0)
MONOS PCT: 8.1 % (ref 3.0–12.0)
Monocytes Absolute: 0.9 10*3/uL (ref 0.1–1.0)
NEUTROS ABS: 8.7 10*3/uL — AB (ref 1.4–7.7)
NEUTROS PCT: 75.8 % (ref 43.0–77.0)
PLATELETS: 273 10*3/uL (ref 150.0–400.0)
RBC: 5.07 Mil/uL (ref 4.22–5.81)
RDW: 13.4 % (ref 11.5–15.5)
WBC: 11.4 10*3/uL — ABNORMAL HIGH (ref 4.0–10.5)

## 2015-09-27 LAB — COMPREHENSIVE METABOLIC PANEL
ALBUMIN: 4.3 g/dL (ref 3.5–5.2)
ALT: 22 U/L (ref 0–53)
AST: 22 U/L (ref 0–37)
Alkaline Phosphatase: 54 U/L (ref 39–117)
BUN: 12 mg/dL (ref 6–23)
CALCIUM: 9.4 mg/dL (ref 8.4–10.5)
CHLORIDE: 103 meq/L (ref 96–112)
CO2: 31 meq/L (ref 19–32)
CREATININE: 0.93 mg/dL (ref 0.40–1.50)
GFR: 86.17 mL/min (ref >60.00)
Glucose, Bld: 111 mg/dL — ABNORMAL HIGH (ref 70–99)
POTASSIUM: 3.9 meq/L (ref 3.5–5.1)
Sodium: 141 mEq/L (ref 135–145)
Total Bilirubin: 0.6 mg/dL (ref 0.2–1.2)
Total Protein: 7.2 g/dL (ref 6.0–8.3)

## 2015-09-27 LAB — LIPID PANEL
CHOL/HDL RATIO: 4
CHOLESTEROL: 175 mg/dL (ref 0–200)
HDL: 42 mg/dL (ref >39.00)
NONHDL: 132.71
TRIGLYCERIDES: 293 mg/dL — AB (ref 0.0–149.0)
VLDL: 58.6 mg/dL — AB (ref 0.0–40.0)

## 2015-09-27 LAB — LDL CHOLESTEROL, DIRECT: Direct LDL: 101 mg/dL

## 2015-09-27 LAB — MICROALBUMIN / CREATININE URINE RATIO
Creatinine,U: 113.4 mg/dL
Microalb Creat Ratio: 0.6 mg/g (ref 0.0–30.0)

## 2015-09-27 LAB — VITAMIN D 25 HYDROXY (VIT D DEFICIENCY, FRACTURES): VITD: 50.9 ng/mL (ref 30.00–100.00)

## 2015-09-27 LAB — HEMOGLOBIN A1C: Hgb A1c MFr Bld: 6 % (ref 4.6–6.5)

## 2015-09-27 MED ORDER — ATENOLOL 25 MG PO TABS: 75.0000 mg | ORAL_TABLET | Freq: Every day | ORAL | Status: DC

## 2015-09-27 NOTE — Progress Notes (Signed)
Pre-visit discussion using our clinic review tool. No additional management support is needed unless otherwise documented below in the visit note.  

## 2015-09-27 NOTE — Progress Notes (Signed)
Patient ID: DERIN GRANQUIST, male    DOB: Feb 05, 1951  Age: 65 y.o. MRN: [id number]  The patient is here for annual Medicare wellness examination and management of other chronic and acute problems.   The risk factors are reflected in the social history.  The roster of all physicians providing medical care to patient - is listed in the Snapshot section of the chart.  Activities of daily living:  The patient is 100% independent in all ADLs: dressing, toileting, feeding as well as independent mobility  Home safety : The patient has smoke detectors in the home. They wear seatbelts.  There are no firearms at home. There is no violence in the home.   There is no risks for hepatitis, STDs or HIV. There is no   history of blood transfusion. They have no travel history to infectious disease endemic areas of the world.  The patient has NOT seen their dentist in the last six month. They have NOT seen their eye doctor in the last year. They admit to slight hearing difficulty with regard to whispered voices and some television programs.  They have deferred audiologic testing in the last year.  They do not  have excessive sun exposure. Discussed the need for sun protection: hats, long sleeves and use of sunscreen if there is significant sun exposure.   Diet: the importance of a healthy diet is discussed. They do have a healthy diet.  The benefits of regular aerobic exercise were discussed. She walks 4 times per week ,  20 minutes.   Depression screen: there are multiple signs or vegative symptoms of depression-  change in appetite, anhedonia, sadness/tearfullness.He is under the care f Dr Beverlee Nims (psychiatry) and sees him every 2 weeks   Cognitive assessment: the patient manages all their financial and personal affairs and is actively engaged. They could relate day,date,year and events; recalled 2/3 objects at 3 minutes; performed clock-face test normally.  The following portions of the patient's history  were reviewed and updated as appropriate: allergies, current medications, past family history, past medical history,  past surgical history, past social history  and problem list.  Visual acuity was not assessed per patient preference since she has regular follow up with her ophthalmologist. Hearing and body mass index were assessed and reviewed.   During the course of the visit the patient was educated and counseled about appropriate screening and preventive services including : fall prevention , diabetes screening, nutrition counseling, colorectal cancer screening, and recommended immunizations.    CC: The primary encounter diagnosis was Bilateral presbyopia. Diagnoses of Other fatigue, Vitamin D deficiency, Impaired fasting glucose, Essential hypertension, Need for prophylactic vaccination against Streptococcus pneumoniae (pneumococcus), Depression, and Encounter for preventive health examination were also pertinent to this visit.  History Dia has a past medical history of Hyperlipidemia; Paroxysmal atrial fibrillation (HCC); Depression; and Ulcerative colitis (HCC).   He has no past surgical history on file.   His family history is negative for Cancer and Heart disease.He reports that he has been smoking Cigarettes.  He has never used smokeless tobacco. He reports that he drinks alcohol. He reports that he does not use illicit drugs.  Outpatient Prescriptions Prior to Visit  Medication Sig Dispense Refill  . ARIPiprazole (ABILIFY) 2 MG tablet Take 2 mg by mouth daily.    Marland Kitchen aspirin 81 MG tablet Take 81 mg by mouth daily.    Marland Kitchen atorvastatin (LIPITOR) 10 MG tablet Take 2 tablets (20 mg total) by mouth daily. 30 tablet  1  . atorvastatin (LIPITOR) 20 MG tablet TAKE 1 TABLET EVERY DAY 90 tablet 0  . Eszopiclone 3 MG TABS Take 3 mg by mouth at bedtime and may repeat dose one time if needed.    . mirtazapine (REMERON) 15 MG tablet Take 15 mg by mouth daily.    . Multiple Vitamin (MULTIVITAMIN)  tablet Take 1 tablet by mouth daily.    . zaleplon (SONATA) 10 MG capsule Take 1 capsule (10 mg total) by mouth at bedtime. 30 capsule 5  . atenolol (TENORMIN) 25 MG tablet TAKE THREE TABLETS DAILY 270 tablet 0  . Azelastine-Fluticasone (DYMISTA) 137-50 MCG/ACT SUSP 2 squirts In each nostril daily (Patient not taking: Reported on 09/27/2015) 23 g 11  . fluticasone (FLONASE) 50 MCG/ACT nasal spray Place 2 sprays into the nose daily. (Patient not taking: Reported on 09/27/2015) 16 g 6  . Melatonin 3 MG CAPS Take 1 capsule by mouth at bedtime and may repeat dose one time if needed. Reported on 09/27/2015    . sildenafil (VIAGRA) 50 MG tablet Take 1 tablet (50 mg total) by mouth daily as needed for erectile dysfunction. (Patient not taking: Reported on 09/27/2015) 3 tablet 0   No facility-administered medications prior to visit.    Review of Systems   Patient denies headache, fevers, malaise, unintentional weight loss, skin rash, eye pain, sinus congestion and sinus pain, sore throat, dysphagia,  hemoptysis , cough, dyspnea, wheezing, chest pain, palpitations, orthopnea, edema, abdominal pain, nausea, melena, diarrhea, constipation, flank pain, dysuria, hematuria, urinary  Frequency, nocturia, numbness, tingling, seizures,  Focal weakness, Loss of consciousness,  Tremor, , anxiety, and suicidal ideation.      Objective:  BP 122/70 mmHg  Pulse 63  Temp(Src) 98.6 F (37 C) (Oral)  Resp 12  Ht  (1.753 m)  Wt 168 lb 8 oz (76.431 kg)  BMI 24.87 kg/m2  SpO2 96%  Physical Exam   General appearance: alert, cooperative and appears stated age Ears: normal TM's and external ear canals both ears Throat: lips, mucosa, and tongue normal; teeth and gums normal Neck: no adenopathy, no carotid bruit, supple, symmetrical, trachea midline and thyroid not enlarged, symmetric, no tenderness/mass/nodules Back: symmetric, no curvature. ROM normal. No CVA tenderness. Lungs: clear to auscultation  bilaterally Heart: regular rate and rhythm, S1, S2 normal, no murmur, click, rub or gallop Abdomen: soft, non-tender; bowel sounds normal; no masses,  no organomegaly Pulses: 2+ and symmetric Skin: Skin color, texture, turgor normal. No rashes or lesions Lymph nodes: Cervical, supraclavicular, and axillary nodes normal. Psych: affect flat, mood depressed,  Makes good eye contact. Denies suicidality    Assessment & Plan:   Problem List Items Addressed This Visit    Depression    Currently managed by Dr Beverlee Nims at [facility].  With mirtazipine,  Abilify,  Sonata and lunesta.  He has had suicidal thoughts,  But no plan and states that his [employer] Knuckles faith prevents him from acting on his thoughts.         Encounter for preventive health examination    Annual comprehensive preventive exam was done as well as an evaluation and management of acute and chronic conditions .  During the course of the visit the patient was educated and counseled about appropriate screening and preventive services including :  diabetes screening, lipid analysis with projected  10 year  risk for CAD , nutrition counseling, prostate and colorectal cancer screening, and recommended immunizations.  Printed recommendations for health maintenance screenings was given.  Other Visit Diagnoses    Bilateral presbyopia    -  Primary    Relevant Orders    Ambulatory referral to Ophthalmology    Other fatigue        Relevant Orders    CBC with Differential/Platelet (Completed)    Vitamin D deficiency        Relevant Orders    VITAMIN D 25 Hydroxy (Vit-D Deficiency, Fractures) (Completed)    Impaired fasting glucose        Relevant Orders    Hemoglobin A1c (Completed)    Comprehensive metabolic panel (Completed)    Essential hypertension        Relevant Medications    atenolol (TENORMIN) 25 MG tablet    Other Relevant Orders    Lipid panel (Completed)    Microalbumin / creatinine urine ratio (Completed)    LDL  cholesterol, direct (Completed)    Need for prophylactic vaccination against Streptococcus pneumoniae (pneumococcus)        Relevant Orders    Pneumococcal polysaccharide vaccine 23-valent greater than or equal to 2yo subcutaneous/IM (Completed)       I have discontinued Mr. Parkinson's sildenafil. I have also changed his atenolol. Additionally, I am having him maintain his multivitamin, zaleplon, fluticasone, Eszopiclone, mirtazapine, Melatonin, ARIPiprazole, aspirin, Azelastine-Fluticasone, atorvastatin, and atorvastatin.  Meds ordered this encounter  Medications  . atenolol (TENORMIN) 25 MG tablet    Sig: Take 3 tablets (75 mg total) by mouth daily.    Dispense:  270 tablet    Refill:  3    Medications Discontinued During This Encounter  Medication Reason  . sildenafil (VIAGRA) 50 MG tablet Error  . atenolol (TENORMIN) 25 MG tablet Reorder    Follow-up: No Follow-up on file.   Sherlene ShamsULLO, Avry Roedl L, MD

## 2015-09-27 NOTE — Patient Instructions (Signed)

## 2015-09-28 NOTE — Assessment & Plan Note (Signed)
Currently [facility]  With mirtazipine,  Abilify,  Sonata and lunesta.  He has had suicidal thoughts,  But no plan and states that his Shane KnucklesChristian faith prevents him from acting on his thoughts.

## 2015-09-28 NOTE — Assessment & Plan Note (Signed)

## 2015-09-29 ENCOUNTER — Other Ambulatory Visit: Payer: Self-pay | Admitting: Internal Medicine

## 2015-10-02 DIAGNOSIS — D72829 Elevated white blood cell count, unspecified: Secondary | ICD-10-CM | POA: Insufficient documentation

## 2015-10-02 NOTE — Addendum Note (Signed)
Addended by: Sherlene ShamsULLO, Brysan Mcevoy L on: 10/02/2015 07:33 PM   Modules accepted: Orders, SmartSet

## 2015-10-03 ENCOUNTER — Encounter: Payer: Self-pay | Admitting: *Deleted

## 2015-10-14 DIAGNOSIS — H2513 Age-related nuclear cataract, bilateral: Secondary | ICD-10-CM | POA: Diagnosis not present

## 2016-09-08 ENCOUNTER — Other Ambulatory Visit: Payer: Self-pay | Admitting: Internal Medicine

## 2016-09-28 ENCOUNTER — Other Ambulatory Visit: Payer: Self-pay | Admitting: Internal Medicine

## 2016-10-05 ENCOUNTER — Other Ambulatory Visit: Payer: Self-pay | Admitting: Internal Medicine

## 2016-10-10 ENCOUNTER — Encounter: Payer: Self-pay | Admitting: Internal Medicine

## 2016-10-10 ENCOUNTER — Ambulatory Visit (INDEPENDENT_AMBULATORY_CARE_PROVIDER_SITE_OTHER): Payer: Medicare HMO | Admitting: Internal Medicine

## 2016-10-10 VITALS — BP 122/70 | HR 54 | Temp 98.7°F | Resp 15 | Ht 69.0 in | Wt 170.6 lb

## 2016-10-10 DIAGNOSIS — Z125 Encounter for screening for malignant neoplasm of prostate: Secondary | ICD-10-CM | POA: Diagnosis not present

## 2016-10-10 DIAGNOSIS — E785 Hyperlipidemia, unspecified: Secondary | ICD-10-CM | POA: Diagnosis not present

## 2016-10-10 DIAGNOSIS — F3341 Major depressive disorder, recurrent, in partial remission: Secondary | ICD-10-CM | POA: Diagnosis not present

## 2016-10-10 DIAGNOSIS — Z720 Tobacco use: Secondary | ICD-10-CM

## 2016-10-10 DIAGNOSIS — D72823 Leukemoid reaction: Secondary | ICD-10-CM | POA: Diagnosis not present

## 2016-10-10 DIAGNOSIS — R635 Abnormal weight gain: Secondary | ICD-10-CM | POA: Diagnosis not present

## 2016-10-10 DIAGNOSIS — F5104 Psychophysiologic insomnia: Secondary | ICD-10-CM

## 2016-10-10 DIAGNOSIS — E559 Vitamin D deficiency, unspecified: Secondary | ICD-10-CM

## 2016-10-10 DIAGNOSIS — Z0000 Encounter for general adult medical examination without abnormal findings: Secondary | ICD-10-CM

## 2016-10-10 LAB — COMPREHENSIVE METABOLIC PANEL
ALBUMIN: 4.4 g/dL (ref 3.5–5.2)
ALT: 22 U/L (ref 0–53)
AST: 19 U/L (ref 0–37)
Alkaline Phosphatase: 51 U/L (ref 39–117)
BUN: 13 mg/dL (ref 6–23)
CALCIUM: 9.3 mg/dL (ref 8.4–10.5)
CHLORIDE: 105 meq/L (ref 96–112)
CO2: 27 mEq/L (ref 19–32)
Creatinine, Ser: 0.97 mg/dL (ref 0.40–1.50)
GFR: 81.83 mL/min (ref >60.00)
Glucose, Bld: 120 mg/dL — ABNORMAL HIGH (ref 70–99)
POTASSIUM: 4 meq/L (ref 3.5–5.1)
SODIUM: 138 meq/L (ref 135–145)
TOTAL PROTEIN: 6.8 g/dL (ref 6.0–8.3)
Total Bilirubin: 0.6 mg/dL (ref 0.2–1.2)

## 2016-10-10 LAB — LIPID PANEL
CHOL/HDL RATIO: 4
CHOLESTEROL: 167 mg/dL (ref 0–200)
HDL: 41.8 mg/dL (ref >39.00)
NonHDL: 124.97
Triglycerides: 267 mg/dL — ABNORMAL HIGH (ref 0.0–149.0)
VLDL: 53.4 mg/dL — ABNORMAL HIGH (ref 0.0–40.0)

## 2016-10-10 LAB — CBC WITH DIFFERENTIAL/PLATELET
BASOS PCT: 0.8 % (ref 0.0–3.0)
Basophils Absolute: 0.1 10*3/uL (ref 0.0–0.1)
EOS ABS: 0.2 10*3/uL (ref 0.0–0.7)
Eosinophils Relative: 2 % (ref 0.0–5.0)
HCT: 46.7 % (ref 39.0–52.0)
HEMOGLOBIN: 15.6 g/dL (ref 13.0–17.0)
LYMPHS ABS: 1.9 10*3/uL (ref 0.7–4.0)
Lymphocytes Relative: 24.2 % (ref 12.0–46.0)
MCHC: 33.3 g/dL (ref 30.0–36.0)
MCV: 96.3 fl (ref 78.0–100.0)
MONO ABS: 0.9 10*3/uL (ref 0.1–1.0)
Monocytes Relative: 11.3 % (ref 3.0–12.0)
NEUTROS PCT: 61.7 % (ref 43.0–77.0)
Neutro Abs: 4.7 10*3/uL (ref 1.4–7.7)
PLATELETS: 272 10*3/uL (ref 150.0–400.0)
RBC: 4.86 Mil/uL (ref 4.22–5.81)
RDW: 13.1 % (ref 11.5–15.5)
WBC: 7.7 10*3/uL (ref 4.0–10.5)

## 2016-10-10 LAB — PSA, MEDICARE: PSA: 1.33 ng/ml (ref 0.10–4.00)

## 2016-10-10 LAB — TSH: TSH: 0.91 u[IU]/mL (ref 0.35–4.50)

## 2016-10-10 LAB — VITAMIN D 25 HYDROXY (VIT D DEFICIENCY, FRACTURES): VITD: 47.92 ng/mL (ref 30.00–100.00)

## 2016-10-10 LAB — LDL CHOLESTEROL, DIRECT: Direct LDL: 89 mg/dL

## 2016-10-10 NOTE — Progress Notes (Signed)
Patient ID: Shane Petersen, male    DOB: May 02, 1951  Age: 66 y.o. MRN: [id number]  The patient is here for annual  wellness examination and management of other chronic and acute problems.    He is overdue for colonoscopy despite history of ulcerative colitis   The risk factors are reflected in the social history.  The roster of all physicians providing medical care to patient - is listed in the Snapshot section of the chart.  Activities of daily living:  The patient is 100% independent in all ADLs: dressing, toileting, feeding as well as independent mobility  Home safety : The patient has smoke detectors in the home. They wear seatbelts.  There are no firearms at home. There is no violence in the home.   There is no risks for hepatitis, STDs or HIV. There is no   history of blood transfusion. They have no travel history to infectious disease endemic areas of the world.  The patient has seen their dentist in the last six month. They have seen their eye doctor in the last year. They admit to slight hearing difficulty with regard to whispered voices and some television programs.  They have deferred audiologic testing in the last year.  They do not  have excessive sun exposure. Discussed the need for sun protection: hats, long sleeves and use of sunscreen if there is significant sun exposure.   Diet: the importance of a healthy diet is discussed. They do have a healthy diet.  The benefits of regular aerobic exercise were discussed. he walks 4 times per week ,  20 minutes.   Depression screen: there are no signs or vegative symptoms of depression- irritability, change in appetite, anhedonia, sadness/tearfullness.  Cognitive assessment: the patient manages all their financial and personal affairs and is actively engaged. They could relate day,date,year and events; recalled 2/3 objects at 3 minutes; performed clock-face test normally.  The following portions of the patient's history were reviewed  and updated as appropriate: allergies, current medications, past family history, past medical history,  past surgical history, past social history  and problem list.  Visual acuity was not assessed per patient preference since she has regular follow up with her ophthalmologist. Hearing and body mass index were assessed and reviewed.   During the course of the visit the patient was educated and counseled about appropriate screening and preventive services including : fall prevention , diabetes screening, nutrition counseling, colorectal cancer screening, and recommended immunizations.    CC: The primary encounter diagnosis was Hyperlipidemia LDL goal <130. Diagnoses of Special screening for malignant neoplasm of prostate, Leukemoid reaction, Vitamin D deficiency, Hyperlipidemia LDL goal <100, Weight gain, Encounter for preventive health examination, Recurrent major depressive disorder, in partial remission (HCC), Psychophysiological insomnia, and Tobacco abuse, episodic were also pertinent to this visit.  History Shane Petersen has a past medical history of Depression; Hyperlipidemia; Paroxysmal atrial fibrillation (HCC); and Ulcerative colitis (HCC).   He has no past surgical history on file.   His family history is not on file.He reports that he has been smoking Cigarettes.  He has never used smokeless tobacco. He reports that he drinks alcohol. He reports that he does not use drugs.  Outpatient Medications Prior to Visit  Medication Sig Dispense Refill  . ARIPiprazole (ABILIFY) 2 MG tablet Take 2 mg by mouth daily.    Marland Kitchen aspirin 81 MG tablet Take 81 mg by mouth daily.    Marland Kitchen atenolol (TENORMIN) 25 MG tablet TAKE 3 TABLETS BY MOUTH DAILY  270 tablet 0  . atorvastatin (LIPITOR) 20 MG tablet TAKE 1 TABLET EVERY DAY 90 tablet 0  . Eszopiclone 3 MG TABS Take 3 mg by mouth at bedtime and may repeat dose one time if needed.    . mirtazapine (REMERON) 15 MG tablet Take 15 mg by mouth daily.    . Multiple Vitamin  (MULTIVITAMIN) tablet Take 1 tablet by mouth daily.    . zaleplon (SONATA) 10 MG capsule Take 1 capsule (10 mg total) by mouth at bedtime. 30 capsule 5  . atorvastatin (LIPITOR) 10 MG tablet Take 2 tablets (20 mg total) by mouth daily. (Patient not taking: Reported on 10/10/2016) 30 tablet 1  . Azelastine-Fluticasone (DYMISTA) 137-50 MCG/ACT SUSP 2 squirts In each nostril daily (Patient not taking: Reported on 09/27/2015) 23 g 11  . fluticasone (FLONASE) 50 MCG/ACT nasal spray Place 2 sprays into the nose daily. (Patient not taking: Reported on 09/27/2015) 16 g 6  . Melatonin 3 MG CAPS Take 1 capsule by mouth at bedtime and may repeat dose one time if needed. Reported on 09/27/2015     No facility-administered medications prior to visit.     Review of Systems   Patient denies headache, fevers, malaise, unintentional weight loss, skin rash, eye pain, sinus congestion and sinus pain, sore throat, dysphagia,  hemoptysis , cough, dyspnea, wheezing, chest pain, palpitations, orthopnea, edema, abdominal pain, nausea, melena, diarrhea, constipation, flank pain, dysuria, hematuria, urinary  Frequency, nocturia, numbness, tingling, seizures,  Focal weakness, Loss of consciousness,  Tremor, insomnia, depression, anxiety, and suicidal ideation.     Objective:  BP 122/70 (BP Location: Left Arm, Patient Position: Sitting, Cuff Size: Normal)   Pulse (!) 54   Temp 98.7 F (37.1 C) (Oral)   Resp 15   Ht  (1.753 m)   Wt 170 lb 9.6 oz (77.4 kg)   SpO2 96%   BMI 25.19 kg/m   Physical Exam   General appearance: alert, cooperative and appears stated age Ears: normal TM's and external ear canals both ears Throat: lips, mucosa, and tongue normal; teeth and gums normal Neck: no adenopathy, no carotid bruit, supple, symmetrical, trachea midline and thyroid not enlarged, symmetric, no tenderness/mass/nodules Back: symmetric, no curvature. ROM normal. No CVA tenderness. Lungs: clear to auscultation  bilaterally Heart: regular rate and rhythm, S1, S2 normal, no murmur, click, rub or gallop Abdomen: soft, non-tender; bowel sounds normal; no masses,  no organomegaly Pulses: 2+ and symmetric Skin: Skin color, texture, turgor normal. No rashes or lesions Lymph nodes: Cervical, supraclavicular, and axillary nodes normal.    Assessment & Plan:   Problem List Items Addressed This Visit    Depression    Currently managed by Dr Beverlee Nims at [facility].  With mirtazipine,  Abilify,  Sonata and lunesta.  He has had suicidal thoughts,  But no plan and states that his Shane Petersen faith prevents him from acting on his thoughts.         Encounter for preventive health examination    Annual comprehensive preventive exam was done as well as an evaluation and management of acute and chronic conditions .  During the course of the visit the patient was educated and counseled about appropriate screening and preventive services including :  diabetes screening, lipid analysis with projected  10 year  risk for CAD , nutrition counseling, prostate and colorectal cancer screening, and recommended immunizations.  Printed recommendations for health maintenance screenings was given.   He has chosen to use  Cologuard for  his colon ca screening since he has no symptom sof UC       Hyperlipidemia LDL goal <130 - Primary    He is  fasting and cannot return for fasting labs. LDL  Is at goal on current medications. He has no side effects and liver enzymes are normal. No changes today   Lab Results  Component Value Date   CHOL 167 10/10/2016   HDL 41.80 10/10/2016   LDLCALC 60 11/27/2011   LDLDIRECT 89.0 10/10/2016   TRIG 267.0 (H) 10/10/2016   CHOLHDL 4 10/10/2016   Lab Results  Component Value Date   ALT 22 10/10/2016   AST 19 10/10/2016   ALKPHOS 51 10/10/2016   BILITOT 0.6 10/10/2016         Relevant Orders   Lipid panel (Completed)   LDL cholesterol, direct (Completed)   Insomnia    Chronic ,   Managed with continue lorazepam and sonata       Leukocytosis   Relevant Orders   CBC with Differential/Platelet (Completed)   Special screening for malignant neoplasm of prostate   Relevant Orders   PSA, Medicare (Completed)   Tobacco abuse, episodic    He has begun smoking again.  less than 1/ 2 pack daily .  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 Kehoe's fluticasone, Melatonin, and Azelastine-Fluticasone. I am also having him maintain his multivitamin, zaleplon, Eszopiclone, mirtazapine, ARIPiprazole, aspirin, atenolol, and atorvastatin.  No orders of the defined types were placed in this encounter.   Medications Discontinued During This Encounter  Medication Reason  . atorvastatin (LIPITOR) 10 MG tablet Patient has not taken in last 30 days  . Azelastine-Fluticasone (DYMISTA) 137-50 MCG/ACT SUSP Patient has not taken in last 30 days  . fluticasone (FLONASE) 50 MCG/ACT nasal spray Patient has not taken in last 30 days  . Melatonin 3 MG CAPS Patient has not taken in last 30 days    Follow-up: No Follow-up on file.   Sherlene Shams, MD

## 2016-10-10 NOTE — Patient Instructions (Signed)
I recommending completing  The cologuard test as your colon ca screening.  If it is positive,  We'll have you see Dr Kinnie Scales, AND I WILL DRIVE YOU!!!   Health Maintenance, Male A healthy lifestyle and preventive care is important for your health and wellness. Ask your health care provider about what schedule of regular examinations is right for you. What should I know about weight and diet?  Eat a Healthy Diet  Eat plenty of vegetables, fruits, whole grains, low-fat dairy products, and lean protein.  Do not eat a lot of foods high in solid fats, added sugars, or salt. Maintain a Healthy Weight  Regular exercise can help you achieve or maintain a healthy weight. You should:  Do at least 150 minutes of exercise each week. The exercise should increase your heart rate and make you sweat (moderate-intensity exercise).  Do strength-training exercises at least twice a week. Watch Your Levels of Cholesterol and Blood Lipids  Have your blood tested for lipids and cholesterol every 5 years starting at 68 years of age. If you are at high risk for heart disease, you should start having your blood tested when you are 68 years old. You may need to have your cholesterol levels checked more often if:  Your lipid or cholesterol levels are high.  You are older than 68 years of age.  You are at high risk for heart disease. What should I know about cancer screening? Many types of cancers can be detected early and may often be prevented. Lung Cancer  You should be screened every year for lung cancer if:  You are a current smoker who has smoked for at least 30 years.  You are a former smoker who has quit within the past 15 years.  Talk to your health care provider about your screening options, when you should start screening, and how often you should be screened. Colorectal Cancer  Routine colorectal cancer screening usually begins at 68 years of age and should be repeated every 5-10 years until  you are 68 years old. You may need to be screened more often if early forms of precancerous polyps or small growths are found. Your health care provider may recommend screening at an earlier age if you have risk factors for colon cancer.  Your health care provider may recommend using home test kits to check for hidden blood in the stool.  A small camera at the end of a tube can be used to examine your colon (sigmoidoscopy or colonoscopy). This checks for the earliest forms of colorectal cancer. Prostate and Testicular Cancer  Depending on your age and overall health, your health care provider may do certain tests to screen for prostate and testicular cancer.  Talk to your health care provider about any symptoms or concerns you have about testicular or prostate cancer. Skin Cancer  Check your skin from head to toe regularly.  Tell your health care provider about any new moles or changes in moles, especially if:  There is a change in a mole's size, shape, or color.  You have a mole that is larger than a pencil eraser.  Always use sunscreen. Apply sunscreen liberally and repeat throughout the day.  Protect yourself by wearing long sleeves, pants, a wide-brimmed hat, and sunglasses when outside. What should I know about heart disease, diabetes, and high blood pressure?  If you are 8-73 years of age, have your blood pressure checked every 3-5 years. If you are 56 years of age or older,  have your blood pressure checked every year. You should have your blood pressure measured twice-once when you are at a hospital or clinic, and once when you are not at a hospital or clinic. Record the average of the two measurements. To check your blood pressure when you are not at a hospital or clinic, you can use:  An automated blood pressure machine at a pharmacy.  A home blood pressure monitor.  Talk to your health care provider about your target blood pressure.  If you are between 68-65 years old, ask  your health care provider if you should take aspirin to prevent heart disease.  Have regular diabetes screenings by checking your fasting blood sugar level.  If you are at a normal weight and have a low risk for diabetes, have this test once every three years after the age of 39.  If you are overweight and have a high risk for diabetes, consider being tested at a younger age or more often.  A one-time screening for abdominal aortic aneurysm (AAA) by ultrasound is recommended for men aged 74-75 years who are current or former smokers. What should I know about preventing infection? Hepatitis B  If you have a higher risk for hepatitis B, you should be screened for this virus. Talk with your health care provider to find out if you are at risk for hepatitis B infection. Hepatitis C  Blood testing is recommended for:  Everyone born from [birth year] through 1967.  Anyone with known risk factors for hepatitis C. Sexually Transmitted Diseases (STDs)  You should be screened each year for STDs including gonorrhea and chlamydia if:  You are sexually active and are younger than 68 years of age.  You are older than 68 years of age and your health care provider tells you that you are at risk for this type of infection.  Your sexual activity has changed since you were last screened and you are at an increased risk for chlamydia or gonorrhea. Ask your health care provider if you are at risk.  Talk with your health care provider about whether you are at high risk of being infected with HIV. Your health care provider may recommend a prescription medicine to help prevent HIV infection. What else can I do?  Schedule regular health, dental, and eye exams.  Stay current with your vaccines (immunizations).  Do not use any tobacco products, such as cigarettes, chewing tobacco, and e-cigarettes. If you need help quitting, ask your health care provider.  Limit alcohol intake to no more than 2 drinks per day. One  drink equals 12 ounces of beer, 5 ounces of wine, or 1 ounces of hard liquor.  Do not use street drugs.  Do not share needles.  Ask your health care provider for help if you need support or information about quitting drugs.  Tell your health care provider if you often feel depressed.  Tell your health care provider if you have ever been abused or do not feel safe at home. This information is not intended to replace advice given to you by your health care provider. Make sure you discuss any questions you have with your health care provider. Document Released: 11/24/2007 Document Revised: 01/25/2016 Document Reviewed: 03/01/2015 Elsevier Interactive Patient Education  2017 [employer].

## 2016-10-10 NOTE — Progress Notes (Signed)
Pre visit review using our clinic review tool, if applicable. No additional management support is needed unless otherwise documented below in the visit note. 

## 2016-10-13 ENCOUNTER — Encounter: Payer: Self-pay | Admitting: Internal Medicine

## 2016-10-13 NOTE — Assessment & Plan Note (Signed)
He has begun smoking again.  less than 1/ 2 pack daily .  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

## 2016-10-13 NOTE — Assessment & Plan Note (Signed)
Chronic ,  Managed with continue lorazepam and sonata

## 2016-10-13 NOTE — Assessment & Plan Note (Signed)
Currently [facility].  With mirtazipine,  Abilify,  Sonata and lunesta.  He has had suicidal thoughts,  But no plan and states that his Shane KnucklesChristian faith prevents him from acting on his thoughts.

## 2016-10-13 NOTE — Assessment & Plan Note (Signed)
Annual comprehensive preventive exam was done as well as an evaluation and management of acute and chronic conditions .  During the course of the visit the patient was educated and counseled about appropriate screening and preventive services including :  diabetes screening, lipid analysis with projected  10 year  risk for CAD , nutrition counseling, prostate and colorectal cancer screening, and recommended immunizations.  Printed recommendations for health maintenance screenings was given.   He has chosen to use  Cologuard for his colon ca screening since he has no symptom sof UC

## 2016-10-13 NOTE — Assessment & Plan Note (Signed)
He is  fasting and cannot return for fasting labs. LDL  Is at goal on current medications. He has no side effects and liver enzymes are normal. No changes today   Lab Results  Component Value Date   CHOL 167 10/10/2016   HDL 41.80 10/10/2016   LDLCALC 60 11/27/2011   LDLDIRECT 89.0 10/10/2016   TRIG 267.0 (H) 10/10/2016   CHOLHDL 4 10/10/2016   Lab Results  Component Value Date   ALT 22 10/10/2016   AST 19 10/10/2016   ALKPHOS 51 10/10/2016   BILITOT 0.6 10/10/2016

## 2016-10-15 ENCOUNTER — Encounter: Payer: Self-pay | Admitting: Internal Medicine

## 2016-10-15 DIAGNOSIS — Z1212 Encounter for screening for malignant neoplasm of rectum: Secondary | ICD-10-CM | POA: Diagnosis not present

## 2016-10-15 DIAGNOSIS — Z1211 Encounter for screening for malignant neoplasm of colon: Secondary | ICD-10-CM | POA: Diagnosis not present

## 2016-10-16 NOTE — Progress Notes (Signed)
faxed

## 2016-10-19 LAB — COLOGUARD

## 2016-10-29 ENCOUNTER — Telehealth: Payer: Self-pay | Admitting: Internal Medicine

## 2016-10-29 NOTE — Telephone Encounter (Signed)
Spoke with Dr. Barnabas ListerWashington and informed him of his cologuard results. Pt gave a verbal understanding.

## 2016-10-29 NOTE — Telephone Encounter (Signed)
Please let Dr Barnabas ListerWashington know that The results of patient's cologuard is negative.   We will repeat in 3 years  For colon CA screening.

## 2016-11-12 DIAGNOSIS — H2513 Age-related nuclear cataract, bilateral: Secondary | ICD-10-CM | POA: Diagnosis not present

## 2016-12-10 ENCOUNTER — Other Ambulatory Visit: Payer: Self-pay | Admitting: Internal Medicine

## 2016-12-14 ENCOUNTER — Other Ambulatory Visit: Payer: Self-pay | Admitting: Internal Medicine

## 2017-06-17 ENCOUNTER — Other Ambulatory Visit: Payer: Self-pay | Admitting: Internal Medicine

## 2017-10-16 ENCOUNTER — Encounter: Payer: Self-pay | Admitting: Internal Medicine

## 2017-10-16 ENCOUNTER — Ambulatory Visit (INDEPENDENT_AMBULATORY_CARE_PROVIDER_SITE_OTHER): Payer: Medicare HMO | Admitting: Internal Medicine

## 2017-10-16 VITALS — BP 142/76 | HR 57 | Temp 98.6°F | Resp 15 | Ht 69.0 in | Wt 168.6 lb

## 2017-10-16 DIAGNOSIS — R5383 Other fatigue: Secondary | ICD-10-CM | POA: Diagnosis not present

## 2017-10-16 DIAGNOSIS — F5104 Psychophysiologic insomnia: Secondary | ICD-10-CM

## 2017-10-16 DIAGNOSIS — Z720 Tobacco use: Secondary | ICD-10-CM | POA: Diagnosis not present

## 2017-10-16 DIAGNOSIS — E7800 Pure hypercholesterolemia, unspecified: Secondary | ICD-10-CM | POA: Diagnosis not present

## 2017-10-16 DIAGNOSIS — E559 Vitamin D deficiency, unspecified: Secondary | ICD-10-CM | POA: Diagnosis not present

## 2017-10-16 DIAGNOSIS — E785 Hyperlipidemia, unspecified: Secondary | ICD-10-CM

## 2017-10-16 DIAGNOSIS — I480 Paroxysmal atrial fibrillation: Secondary | ICD-10-CM

## 2017-10-16 DIAGNOSIS — F3341 Major depressive disorder, recurrent, in partial remission: Secondary | ICD-10-CM | POA: Diagnosis not present

## 2017-10-16 DIAGNOSIS — I10 Essential (primary) hypertension: Secondary | ICD-10-CM

## 2017-10-16 DIAGNOSIS — Z0000 Encounter for general adult medical examination without abnormal findings: Secondary | ICD-10-CM

## 2017-10-16 DIAGNOSIS — Z125 Encounter for screening for malignant neoplasm of prostate: Secondary | ICD-10-CM

## 2017-10-16 LAB — COMPREHENSIVE METABOLIC PANEL
ALBUMIN: 4.2 g/dL (ref 3.5–5.2)
ALK PHOS: 52 U/L (ref 39–117)
ALT: 22 U/L (ref 0–53)
AST: 20 U/L (ref 0–37)
BILIRUBIN TOTAL: 0.6 mg/dL (ref 0.2–1.2)
BUN: 11 mg/dL (ref 6–23)
CO2: 26 meq/L (ref 19–32)
CREATININE: 0.95 mg/dL (ref 0.40–1.50)
Calcium: 9.3 mg/dL (ref 8.4–10.5)
Chloride: 103 mEq/L (ref 96–112)
GFR: 83.57 mL/min (ref >60.00)
Glucose, Bld: 95 mg/dL (ref 70–99)
Potassium: 4.4 mEq/L (ref 3.5–5.1)
Sodium: 138 mEq/L (ref 135–145)
TOTAL PROTEIN: 6.5 g/dL (ref 6.0–8.3)

## 2017-10-16 LAB — LIPID PANEL
Cholesterol: 168 mg/dL (ref 0–200)
HDL: 39.4 mg/dL (ref >39.00)
NonHDL: 128.13
TRIGLYCERIDES: 269 mg/dL — AB (ref 0.0–149.0)
Total CHOL/HDL Ratio: 4
VLDL: 53.8 mg/dL — AB (ref 0.0–40.0)

## 2017-10-16 LAB — LDL CHOLESTEROL, DIRECT: LDL DIRECT: 93 mg/dL

## 2017-10-16 LAB — CBC WITH DIFFERENTIAL/PLATELET
Basophils Absolute: 0.1 10*3/uL (ref 0.0–0.1)
Basophils Relative: 0.9 % (ref 0.0–3.0)
EOS ABS: 0.1 10*3/uL (ref 0.0–0.7)
Eosinophils Relative: 1.7 % (ref 0.0–5.0)
HEMATOCRIT: 46.3 % (ref 39.0–52.0)
HEMOGLOBIN: 15.6 g/dL (ref 13.0–17.0)
LYMPHS PCT: 21.3 % (ref 12.0–46.0)
Lymphs Abs: 1.8 10*3/uL (ref 0.7–4.0)
MCHC: 33.8 g/dL (ref 30.0–36.0)
MCV: 94.8 fl (ref 78.0–100.0)
MONOS PCT: 11.9 % (ref 3.0–12.0)
Monocytes Absolute: 1 10*3/uL (ref 0.1–1.0)
Neutro Abs: 5.4 10*3/uL (ref 1.4–7.7)
Neutrophils Relative %: 64.2 % (ref 43.0–77.0)
Platelets: 247 10*3/uL (ref 150.0–400.0)
RBC: 4.89 Mil/uL (ref 4.22–5.81)
RDW: 12.9 % (ref 11.5–15.5)
WBC: 8.5 10*3/uL (ref 4.0–10.5)

## 2017-10-16 LAB — PSA, MEDICARE: PSA: 1.38 ng/mL (ref 0.10–4.00)

## 2017-10-16 LAB — TSH: TSH: 1.01 u[IU]/mL (ref 0.35–4.50)

## 2017-10-16 LAB — VITAMIN D 25 HYDROXY (VIT D DEFICIENCY, FRACTURES): VITD: 42.83 ng/mL (ref 30.00–100.00)

## 2017-10-16 NOTE — Progress Notes (Signed)
Patient ID: Shane Petersen, male    DOB: 1951-06-07  Age: 67 y.o. MRN: [id number]  The patient is here for annual PREVENTIVE examination and management of other chronic and acute problems.  ALCOHOL AND TOBACCO abuse reviewed .  He is smoking 1/2 PACK DAILY  For the past 6 years  Started at age AGE 39.  Quit for 30 years  SEES GRANDCHILDRESN SEVERAL TIMES PER YEAR  EATS DINNER NIGHTLY WITH NEIGHBOR   GLASS OF WINE       The risk factors are reflected in the social history.  The roster of all physicians providing medical care to patient - is listed in the Snapshot section of the chart.  Activities of daily living:  The patient is 100% independent in all ADLs: dressing, toileting, feeding as well as independent mobility  Home safety : The patient has smoke detectors in the home. They wear seatbelts.  There are no firearms at home. There is no violence in the home.   There is no risks for hepatitis, STDs or HIV. There is no   history of blood transfusion. They have no travel history to infectious disease endemic areas of the world.  The patient has seen their dentist in the last six month. They have seen their eye doctor in the last year. They admit to slight hearing difficulty with regard to whispered voices and some television programs.  They have deferred audiologic testing in the last year.  They do not  have excessive sun exposure. Discussed the need for sun protection: hats, long sleeves and use of sunscreen if there is significant sun exposure.   Diet: the importance of a healthy diet is discussed. They do have a healthy diet.  The benefits of regular aerobic exercise were discussed. She walks 4 times per week ,  20 minutes.   Depression screen: there are no signs or vegative symptoms of depression- irritability, change in appetite, anhedonia, sadness/tearfullness.  Cognitive assessment: the patient manages all their financial and personal affairs and is actively engaged. They could  relate day,date,year and events; recalled 2/3 objects at 3 minutes; performed clock-face test normally.  The following portions of the patient's history were reviewed and updated as appropriate: allergies, current medications, past family history, past medical history,  past surgical history, past social history  and problem list.  Visual acuity was not assessed per patient preference since she has regular follow up with her ophthalmologist. Hearing and body mass index were assessed and reviewed.   During the course of the visit the patient was educated and counseled about appropriate screening and preventive services including : fall prevention , diabetes screening, nutrition counseling, colorectal cancer screening, and recommended immunizations.    CC: The primary encounter diagnosis was Vitamin D deficiency. Diagnoses of Pure hypercholesterolemia, Fatigue, unspecified type, Prostate cancer screening, Encounter for preventive health examination, Tobacco abuse, episodic, Hyperlipidemia LDL goal <130, Paroxysmal atrial fibrillation (HCC), Elevated blood pressure reading with diagnosis of hypertension, Recurrent major depressive disorder, in partial remission (HCC), and Psychophysiological insomnia were also pertinent to this visit.  History Kincaid has a past medical history of Depression, Hyperlipidemia, Paroxysmal atrial fibrillation (HCC), and Ulcerative colitis (HCC).   He has no past surgical history on file.   His family history is not on file.He reports that he has been smoking cigarettes.  He has never used smokeless tobacco. He reports that he drinks alcohol. He reports that he does not use drugs.  Outpatient Medications Prior to Visit  Medication Sig  Dispense Refill  . ARIPiprazole (ABILIFY) 2 MG tablet Take 2 mg by mouth daily.    Marland Kitchen aspirin 81 MG tablet Take 81 mg by mouth daily.    Marland Kitchen atenolol (TENORMIN) 25 MG tablet TAKE 3 TABLETS BY MOUTH DAILY 270 tablet 3  . atorvastatin (LIPITOR)  20 MG tablet TAKE 1 TABLET BY MOUTH DAILY 90 tablet 1  . Eszopiclone 3 MG TABS Take 3 mg by mouth at bedtime and may repeat dose one time if needed.    . mirtazapine (REMERON) 15 MG tablet Take 15 mg by mouth daily.    . Multiple Vitamin (MULTIVITAMIN) tablet Take 1 tablet by mouth daily.    . zaleplon (SONATA) 10 MG capsule Take 1 capsule (10 mg total) by mouth at bedtime. 30 capsule 5   No facility-administered medications prior to visit.     Review of Systems   Patient denies headache, fevers, malaise, unintentional weight loss, skin rash, eye pain, sinus congestion and sinus pain, sore throat, dysphagia,  hemoptysis , cough, dyspnea, wheezing, chest pain, palpitations, orthopnea, edema, abdominal pain, nausea, melena, diarrhea, constipation, flank pain, dysuria, hematuria, urinary  Frequency, nocturia, numbness, tingling, seizures,  Focal weakness, Loss of consciousness,  Tremor, insomnia, depression, anxiety, and suicidal ideation.      Objective:  BP (!) 142/76 (BP Location: Left Arm, Patient Position: Sitting, Cuff Size: Normal)   Pulse (!) 57   Temp 98.6 F (37 C) (Oral)   Resp 15   Ht  (1.753 m)   Wt 168 lb 9.6 oz (76.5 kg)   SpO2 95%   BMI 24.90 kg/m   Physical Exam   General appearance: alert, cooperative and appears stated age Ears: normal TM's and external ear canals both ears Throat: lips, mucosa, and tongue normal; teeth and gums normal Neck: no adenopathy, no carotid bruit, supple, symmetrical, trachea midline and thyroid not enlarged, symmetric, no tenderness/mass/nodules Back: symmetric, no curvature. ROM normal. No CVA tenderness. Lungs: clear to auscultation bilaterally Heart: regular rate and rhythm, S1, S2 normal, no murmur, click, rub or gallop Abdomen: soft, non-tender; bowel sounds normal; no masses,  no organomegaly Pulses: 2+ and symmetric Skin: Skin color, texture, turgor normal. No rashes or lesions Lymph nodes: Cervical, supraclavicular, and  axillary nodes normal.    Assessment & Plan:   Problem List Items Addressed This Visit    Tobacco abuse, episodic    He continues to smoke less than 1/ 2 pack daily .  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  Continue minimal dose of atenolol       Insomnia    Chronic , Managed with Sonata and Lunesta       Hyperlipidemia LDL goal <130    . LDL  Is at goal on current medications. He has no side effects and liver enzymes are normal. No changes today   Lab Results  Component Value Date   CHOL 168 10/16/2017   HDL 39.40 10/16/2017   LDLCALC 60 11/27/2011   LDLDIRECT 93.0 10/16/2017   TRIG 269.0 (H) 10/16/2017   CHOLHDL 4 10/16/2017   Lab Results  Component Value Date   ALT 22 10/16/2017   AST 20 10/16/2017   ALKPHOS 52 10/16/2017   BILITOT 0.6 10/16/2017         Encounter for preventive health examination    Annual comprehensive preventive exam was done as well  as an evaluation and management of acute and chronic conditions .  During the course of the visit the patient was educated and counseled about appropriate screening and preventive services including :  diabetes screening, lipid analysis with projected  10 year  risk for CAD , nutrition counseling, prostate and colorectal cancer screening, and recommended immunizations.  Printed recommendations for health maintenance screenings was given.   Lab Results  Component Value Date   PSA 1.38 10/16/2017   PSA 1.33 10/10/2016   PSA 1.14 09/24/2014         Elevated blood pressure reading with diagnosis of hypertension    he has no history of hypertension but has had several elevated readings.  he has been asked to check her pressures at home and submit readings for evaluation. Renal function will be checked today  Lab Results  Component Value Date   CREATININE 0.95 10/16/2017        Depression    Persistent for years, aggravated by  divorce and professional career Managed by [facility] psychiatrist with abilify, remeron .  No changes today       Other Visit Diagnoses    Vitamin D deficiency    -  Primary   Relevant Orders   VITAMIN D 25 Hydroxy (Vit-D Deficiency, Fractures) (Completed)   Pure hypercholesterolemia       Relevant Orders   Lipid panel (Completed)   LDL cholesterol, direct (Completed)   Fatigue, unspecified type       Relevant Orders   CBC with Differential/Platelet (Completed)   Comprehensive metabolic panel (Completed)   TSH (Completed)   Prostate cancer screening       Relevant Orders   PSA, Medicare (Completed)      I am having Farley L. Mancuso maintain his multivitamin, zaleplon, Eszopiclone, mirtazapine, ARIPiprazole, aspirin, atenolol, and atorvastatin.  No orders of the defined types were placed in this encounter.   There are no discontinued medications.  Follow-up: Return in about 1 year (around 10/17/2018).   Sherlene Shams, MD

## 2017-10-16 NOTE — Patient Instructions (Signed)
The new goals for optimal blood pressure management are 120/70.  Please check your blood pressure a few times at home and send me the readings so I can determine if you need a change in medication    Health Maintenance, Male A healthy lifestyle and preventive care is important for your health and wellness. Ask your health care provider about what schedule of regular examinations is right for you. What should I know about weight and diet? Eat a Healthy Diet  Eat plenty of vegetables, fruits, whole grains, low-fat dairy products, and lean protein.  Do not eat a lot of foods high in solid fats, added sugars, or salt.  Maintain a Healthy Weight Regular exercise can help you achieve or maintain a healthy weight. You should:  Do at least 150 minutes of exercise each week. The exercise should increase your heart rate and make you sweat (moderate-intensity exercise).  Do strength-training exercises at least twice a week.  Watch Your Levels of Cholesterol and Blood Lipids  Have your blood tested for lipids and cholesterol every 5 years starting at 69 years of age. If you are at high risk for heart disease, you should start having your blood tested when you are 69 years old. You may need to have your cholesterol levels checked more often if: ? Your lipid or cholesterol levels are high. ? You are older than 69 years of age. ? You are at high risk for heart disease.  What should I know about cancer screening? Many types of cancers can be detected early and may often be prevented. Lung Cancer  You should be screened every year for lung cancer if: ? You are a current smoker who has smoked for at least 30 years. ? You are a former smoker who has quit within the past 15 years.  Talk to your health care provider about your screening options, when you should start screening, and how often you should be screened.  Colorectal Cancer  Routine colorectal cancer screening usually begins at 69 years  of age and should be repeated every 5-10 years until you are 69 years old. You may need to be screened more often if early forms of precancerous polyps or small growths are found. Your health care provider may recommend screening at an earlier age if you have risk factors for colon cancer.  Your health care provider may recommend using home test kits to check for hidden blood in the stool.  A small camera at the end of a tube can be used to examine your colon (sigmoidoscopy or colonoscopy). This checks for the earliest forms of colorectal cancer.  Prostate and Testicular Cancer  Depending on your age and overall health, your health care provider may do certain tests to screen for prostate and testicular cancer.  Talk to your health care provider about any symptoms or concerns you have about testicular or prostate cancer.  Skin Cancer  Check your skin from head to toe regularly.  Tell your health care provider about any new moles or changes in moles, especially if: ? There is a change in a mole's size, shape, or color. ? You have a mole that is larger than a pencil eraser.  Always use sunscreen. Apply sunscreen liberally and repeat throughout the day.  Protect yourself by wearing long sleeves, pants, a wide-brimmed hat, and sunglasses when outside.  What should I know about heart disease, diabetes, and high blood pressure?  If you are 73-34 years of age, have your blood  pressure checked every 3-5 years. If you are 67 years of age or older, have your blood pressure checked every year. You should have your blood pressure measured twice-once when you are at a hospital or clinic, and once when you are not at a hospital or clinic. Record the average of the two measurements. To check your blood pressure when you are not at a hospital or clinic, you can use: ? An automated blood pressure machine at a pharmacy. ? A home blood pressure monitor.  Talk to your health care provider about your target  blood pressure.  If you are between 64-35 years old, ask your health care provider if you should take aspirin to prevent heart disease.  Have regular diabetes screenings by checking your fasting blood sugar level. ? If you are at a normal weight and have a low risk for diabetes, have this test once every three years after the age of 37. ? If you are overweight and have a high risk for diabetes, consider being tested at a younger age or more often.  A one-time screening for abdominal aortic aneurysm (AAA) by ultrasound is recommended for men aged 69-75 years who are current or former smokers. What should I know about preventing infection? Hepatitis B If you have a higher risk for hepatitis B, you should be screened for this virus. Talk with your health care provider to find out if you are at risk for hepatitis B infection. Hepatitis C Blood testing is recommended for:  Everyone born from [birth year] through 1967.  Anyone with known risk factors for hepatitis C.  Sexually Transmitted Diseases (STDs)  You should be screened each year for STDs including gonorrhea and chlamydia if: ? You are sexually active and are younger than 69 years of age. ? You are older than 69 years of age and your health care provider tells you that you are at risk for this type of infection. ? Your sexual activity has changed since you were last screened and you are at an increased risk for chlamydia or gonorrhea. Ask your health care provider if you are at risk.  Talk with your health care provider about whether you are at high risk of being infected with HIV. Your health care provider may recommend a prescription medicine to help prevent HIV infection.  What else can I do?  Schedule regular health, dental, and eye exams.  Stay current with your vaccines (immunizations).  Do not use any tobacco products, such as cigarettes, chewing tobacco, and e-cigarettes. If you need help quitting, ask your health care  provider.  Limit alcohol intake to no more than 2 drinks per day. One drink equals 12 ounces of beer, 5 ounces of wine, or 1 ounces of hard liquor.  Do not use street drugs.  Do not share needles.  Ask your health care provider for help if you need support or information about quitting drugs.  Tell your health care provider if you often feel depressed.  Tell your health care provider if you have ever been abused or do not feel safe at home. This information is not intended to replace advice given to you by your health care provider. Make sure you discuss any questions you have with your health care provider. Document Released: 11/24/2007 Document Revised: 01/25/2016 Document Reviewed: 03/01/2015 Elsevier Interactive Patient Education  [employer].

## 2017-10-17 DIAGNOSIS — I10 Essential (primary) hypertension: Secondary | ICD-10-CM | POA: Insufficient documentation

## 2017-10-17 NOTE — Assessment & Plan Note (Signed)
Has not had a recurrence.  Continue minimal dose of atenolol  

## 2017-10-17 NOTE — Assessment & Plan Note (Signed)
he has no history of hypertension but has had several elevated readings.  he has been asked to check her pressures at home and submit readings for evaluation. Renal function will be checked today  Lab Results  Component Value Date   CREATININE 0.95 10/16/2017

## 2017-10-17 NOTE — Assessment & Plan Note (Addendum)
Persistent for years, aggravated by divorce and professional career Managed by [facility] psychiatrist with abilify, remeron .  No changes today

## 2017-10-17 NOTE — Assessment & Plan Note (Signed)
He continues to smoke less than 1/ 2 pack daily .  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

## 2017-10-17 NOTE — Assessment & Plan Note (Signed)
Annual comprehensive preventive exam was done as well as an evaluation and management of acute and chronic conditions .  During the course of the visit the patient was educated and counseled about appropriate screening and preventive services including :  diabetes screening, lipid analysis with projected  10 year  risk for CAD , nutrition counseling, prostate and colorectal cancer screening, and recommended immunizations.  Printed recommendations for health maintenance screenings was given.   Lab Results  Component Value Date   PSA 1.38 10/16/2017   PSA 1.33 10/10/2016   PSA 1.14 09/24/2014

## 2017-10-17 NOTE — Assessment & Plan Note (Signed)
Chronic , Managed with [place] and [address]

## 2017-10-17 NOTE — Assessment & Plan Note (Signed)
.   LDL  Is at goal on current medications. He has no side effects and liver enzymes are normal. No changes today   Lab Results  Component Value Date   CHOL 168 10/16/2017   HDL 39.40 10/16/2017   LDLCALC 60 11/27/2011   LDLDIRECT 93.0 10/16/2017   TRIG 269.0 (H) 10/16/2017   CHOLHDL 4 10/16/2017   Lab Results  Component Value Date   ALT 22 10/16/2017   AST 20 10/16/2017   ALKPHOS 52 10/16/2017   BILITOT 0.6 10/16/2017

## 2017-11-30 ENCOUNTER — Other Ambulatory Visit: Payer: Self-pay | Admitting: Internal Medicine

## 2017-12-02 ENCOUNTER — Other Ambulatory Visit: Payer: Self-pay | Admitting: Internal Medicine

## 2018-01-27 DIAGNOSIS — H2513 Age-related nuclear cataract, bilateral: Secondary | ICD-10-CM | POA: Diagnosis not present

## 2018-05-23 ENCOUNTER — Other Ambulatory Visit: Payer: Self-pay | Admitting: Internal Medicine

## 2018-05-27 ENCOUNTER — Other Ambulatory Visit: Payer: Self-pay | Admitting: Internal Medicine

## 2018-10-22 ENCOUNTER — Encounter: Payer: Medicare HMO | Admitting: Internal Medicine

## 2018-11-19 ENCOUNTER — Other Ambulatory Visit: Payer: Self-pay | Admitting: Internal Medicine

## 2018-11-20 ENCOUNTER — Other Ambulatory Visit: Payer: Self-pay | Admitting: Internal Medicine

## 2018-12-08 ENCOUNTER — Ambulatory Visit (INDEPENDENT_AMBULATORY_CARE_PROVIDER_SITE_OTHER): Payer: Medicare HMO | Admitting: Internal Medicine

## 2018-12-08 ENCOUNTER — Other Ambulatory Visit: Payer: Self-pay

## 2018-12-08 ENCOUNTER — Encounter: Payer: Self-pay | Admitting: Internal Medicine

## 2018-12-08 DIAGNOSIS — I480 Paroxysmal atrial fibrillation: Secondary | ICD-10-CM | POA: Diagnosis not present

## 2018-12-08 DIAGNOSIS — I10 Essential (primary) hypertension: Secondary | ICD-10-CM

## 2018-12-08 DIAGNOSIS — F331 Major depressive disorder, recurrent, moderate: Secondary | ICD-10-CM

## 2018-12-08 DIAGNOSIS — E785 Hyperlipidemia, unspecified: Secondary | ICD-10-CM | POA: Diagnosis not present

## 2018-12-08 DIAGNOSIS — Z125 Encounter for screening for malignant neoplasm of prostate: Secondary | ICD-10-CM

## 2018-12-08 NOTE — Progress Notes (Signed)
Telephone Note  This visit type was conducted due to national recommendations for restrictions regarding the COVID-19 pandemic (e.g. social distancing).  This format is felt to be most appropriate for this patient at this time.  All issues noted in this document were discussed and addressed.  No physical exam was performed (except for noted visual exam findings with Video Visits).   I connected with@ on 12/08/18 at  2:00 PM EDT by a video enabled telemedicine application or telephone and verified that I am speaking with the correct person using two identifiers. Location patient: home Location provider: work or home office Persons participating in the virtual visit: patient, provider  I discussed the limitations, risks, security and privacy concerns of performing an evaluation and management service by telephone and the availability of in person appointments. I also discussed with the patient that there may be a patient responsible charge related to this service. The patient expressed understanding and agreed to proceed.  Reason for visit: follow up on hypertension, , PAF, hyperlipidemia  And insomnia.  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    He states that he is doing well.  He is tolerating and taking his medications  as prescribed,  No runs of atrial fib,  Weight stable.  The patient has no signs or symptoms of COVID 19 infection (fever, cough, sore throat  or shortness of breath beyond what is typical for patient).  Patient denies contact with other persons with the above mentioned symptoms or with anyone confirmed to have COVID 19 ,    Depression, chronic : [facility]. Using lunesta, sonata and gabapentin for insomnia    ROS: See pertinent positives and negatives per HPI.  Past Medical History:  Diagnosis Date  .  Depression   . Hyperlipidemia   . Paroxysmal atrial fibrillation (HCC)   . Ulcerative colitis (HCC)     No past surgical history on file.  Family History  Problem Relation Age of Onset  . Cancer Neg Hx   . Heart disease Neg Hx     SOCIAL HX: divorced,  Retired,  Lives alone.  reports that he has been smoking cigarettes. He has never used smokeless tobacco. He reports current alcohol use. He reports that he does not use drugs.   Current Outpatient Medications:  .  ARIPiprazole (ABILIFY) 2 MG tablet, Take 2 mg by mouth daily., Disp: , Rfl:  .  atenolol (TENORMIN) 25 MG tablet, TAKE 3 TABLETS BY MOUTH EVERY DAY, Disp: 270 tablet, Rfl: 1 .  atorvastatin (LIPITOR) 20 MG tablet, TAKE 1 TABLET BY MOUTH EVERY DAY, Disp: 90 tablet, Rfl: 1 .  Eszopiclone 3 MG TABS, Take 3 mg by mouth at bedtime and may repeat dose one time if needed., Disp: , Rfl:  .  gabapentin (NEURONTIN) 100 MG capsule, Take 200 mg by mouth at bedtime., Disp: , Rfl:  .  mirtazapine (REMERON) 15 MG tablet, Take 15 mg by mouth daily., Disp: , Rfl:  .  Multiple Vitamin (MULTIVITAMIN) tablet, Take 1 tablet by mouth daily., Disp: , Rfl:  .  zaleplon (SONATA) 10 MG capsule, Take 1 capsule (10 mg total) by mouth at bedtime., Disp: 30 capsule, Rfl: 5  EXAM:   General impression: alert, cooperative and articulate.  No signs of being in distress  Lungs: speech is fluent sentence length suggests  that patient is not short of breath and not punctuated by cough, sneezing or sniffing. Marland Kitchen   Psych: affect normal.  speech is articulate and non pressured Denies suicidal thoughts    ASSESSMENT AND PLAN:  Major depressive disorder, recurrent episode, moderate ([place]) Persistent for years, aggravated by divorce and professional career difficulty [place] .  Since he has retired his depression has improved .  He continues to maintain regular follow up with [facility] psychiatrist Dr Otelia Sergeant who is prescribing  abilify,  remeron .  No changes today  Paroxysmal atrial fibrillation Has not had a recurrence.  Continue minimal dose of atenolol   Ulcerative colitis He is declined follow up colonoscopy, last one 2015, and prefers to use Cologuard,  Despite the need for periodic surveillance   Hyperlipidemia LDL goal <130 . LDL  Was <100 in 2019 on current medications. He has no side effects and has deferred labs until September 2020.  No changes today   Lab Results  Component Value Date   CHOL 168 10/16/2017   HDL 39.40 10/16/2017   LDLCALC 60 11/27/2011   LDLDIRECT 93.0 10/16/2017   TRIG 269.0 (H) 10/16/2017   CHOLHDL 4 10/16/2017   Lab Results  Component Value Date   ALT 22 10/16/2017   AST 20 10/16/2017   ALKPHOS 52 10/16/2017   BILITOT 0.6 10/16/2017     Elevated blood pressure reading with diagnosis of hypertension he has no history of hypertension but has had several elevated readings I the offifce,  Home readings have been < 140/80.    Lab Results  Component Value Date   CREATININE 0.95 10/16/2017     I discussed the assessment and treatment plan with the patient. The patient was provided an opportunity to ask questions and all were answered. The patient agreed with the plan and demonstrated an understanding of the instructions.   The patient was advised to call back or seek an in-person evaluation if the symptoms worsen or if the condition fails to improve as anticipated.  I provided 22 minutes of non-face-to-face time during this encounter.   Crecencio Mc, MD

## 2018-12-09 NOTE — Assessment & Plan Note (Signed)
he has no history of hypertension but has had several elevated readings I the offifce,  Home readings have been < 140/80.    Lab Results  Component Value Date   CREATININE 0.95 10/16/2017

## 2018-12-09 NOTE — Assessment & Plan Note (Signed)
He is declined follow up colonoscopy, last one 2015, and prefers to use Cologuard,  Despite the need for periodic surveillance

## 2018-12-09 NOTE — Assessment & Plan Note (Signed)
.   LDL  Was <100 in 2019 on current medications. He has no side effects and has deferred labs until September 2020.  No changes today   Lab Results  Component Value Date   CHOL 168 10/16/2017   HDL 39.40 10/16/2017   LDLCALC 60 11/27/2011   LDLDIRECT 93.0 10/16/2017   TRIG 269.0 (H) 10/16/2017   CHOLHDL 4 10/16/2017   Lab Results  Component Value Date   ALT 22 10/16/2017   AST 20 10/16/2017   ALKPHOS 52 10/16/2017   BILITOT 0.6 10/16/2017

## 2018-12-09 NOTE — Assessment & Plan Note (Signed)
Persistent for years, aggravated by divorce and professional career difficulty [place] .  Since he has retired his depression has improved .  He continues to maintain regular follow up with [facility] psychiatrist Dr Otelia Sergeant who is prescribing  abilify, remeron .  No changes today

## 2018-12-09 NOTE — Assessment & Plan Note (Signed)
Has not had a recurrence.  Continue minimal dose of atenolol  

## 2019-02-06 ENCOUNTER — Other Ambulatory Visit: Payer: Self-pay

## 2019-02-10 ENCOUNTER — Ambulatory Visit (INDEPENDENT_AMBULATORY_CARE_PROVIDER_SITE_OTHER): Payer: Medicare HMO | Admitting: Internal Medicine

## 2019-02-10 ENCOUNTER — Encounter: Payer: Self-pay | Admitting: Internal Medicine

## 2019-02-10 ENCOUNTER — Other Ambulatory Visit: Payer: Self-pay

## 2019-02-10 VITALS — BP 158/86 | HR 64 | Temp 98.0°F | Resp 15 | Ht 69.0 in | Wt 159.2 lb

## 2019-02-10 DIAGNOSIS — F331 Major depressive disorder, recurrent, moderate: Secondary | ICD-10-CM

## 2019-02-10 DIAGNOSIS — E785 Hyperlipidemia, unspecified: Secondary | ICD-10-CM

## 2019-02-10 DIAGNOSIS — I480 Paroxysmal atrial fibrillation: Secondary | ICD-10-CM | POA: Diagnosis not present

## 2019-02-10 DIAGNOSIS — F5104 Psychophysiologic insomnia: Secondary | ICD-10-CM

## 2019-02-10 DIAGNOSIS — I10 Essential (primary) hypertension: Secondary | ICD-10-CM

## 2019-02-10 DIAGNOSIS — Z125 Encounter for screening for malignant neoplasm of prostate: Secondary | ICD-10-CM

## 2019-02-10 DIAGNOSIS — R634 Abnormal weight loss: Secondary | ICD-10-CM

## 2019-02-10 NOTE — Progress Notes (Signed)
Patient ID: Shane Petersen, male    DOB: 09/09/1950  Age: 68 y.o. MRN: [id number]  The patient is here for annual preventive  examination and management of other chronic and acute problems.  Last colonoscopy by [facility] over ten years ago.  cologuard normal 2018.   Ruptured  biceps tendon right arm 7 yrs ago  The risk factors are reflected in the social history.  The roster of all physicians providing medical care to patient - is listed in the Snapshot section of the chart.  Activities of daily living:  The patient is 100% independent in all ADLs: dressing, toileting, feeding as well as independent mobility  Home safety : The patient has smoke detectors in the home. They wear seatbelts.  There are no firearms at home. There is no violence in the home.   There is no risks for hepatitis, STDs or HIV. There is no   history of blood transfusion. They have no travel history to infectious disease endemic areas of the world.  The patient has seen their dentist in the last six month. They have seen their eye doctor in the last year. They admit to slight hearing difficulty with regard to whispered voices and some television programs.  They have deferred audiologic testing in the last year.  They do not  have excessive sun exposure. Discussed the need for sun protection: hats, long sleeves and use of sunscreen if there is significant sun exposure.   Diet: the importance of a healthy diet is discussed. They do have a healthy diet.  The benefits of regular aerobic exercise were discussed. Shane Petersen walks 4 times per week ,  20 minutes.   Depression screen: Shane Petersen has chronic depression and insomnia managed by his psychiatrist . There are no new or untreated symptoms of depression- irritability, change in appetite, anhedonia, sadness/tearfullness.  Cognitive assessment: the patient manages all their financial and personal affairs and is actively engaged. They could relate day,date,year and events; recalled 2/3  objects at 3 minutes; performed clock-face test normally.  The following portions of the patient's history were reviewed and updated as appropriate: allergies, current medications, past family history, past medical history,  past surgical history, past social history  and problem list.  Visual acuity was not assessed per patient preference since she has regular follow up with her ophthalmologist. Hearing and body mass index were assessed and reviewed.   During the course of the visit the patient was educated and counseled about appropriate screening and preventive services including : fall prevention , diabetes screening, nutrition counseling, colorectal cancer screening, and recommended immunizations.    CC: The primary encounter diagnosis was Weight loss. Diagnoses of Prostate cancer screening, Hyperlipidemia LDL goal <130, Paroxysmal atrial fibrillation (HCC), Special screening for malignant neoplasm of prostate, Psychophysiological insomnia, Major depressive disorder, recurrent episode, moderate (HCC), and Elevated blood pressure reading with diagnosis of hypertension were also pertinent to this visit.  No new issues.   History Shane Petersen has a past medical history of Depression, Hyperlipidemia, Paroxysmal atrial fibrillation (HCC), and Ulcerative colitis (HCC).   Shane Petersen has no past surgical history on file.   His family history is not on file.Shane Petersen reports that Shane Petersen has been smoking cigarettes. Shane Petersen has never used smokeless tobacco. Shane Petersen reports current alcohol use of about 21.0 standard drinks of alcohol per week. Shane Petersen reports that Shane Petersen does not use drugs.  Outpatient Medications Prior to Visit  Medication Sig Dispense Refill  . ARIPiprazole (ABILIFY) 2 MG tablet Take 2 mg by mouth  daily.    . atenolol (TENORMIN) 25 MG tablet TAKE 3 TABLETS BY MOUTH EVERY DAY 270 tablet 1  . atorvastatin (LIPITOR) 20 MG tablet TAKE 1 TABLET BY MOUTH EVERY DAY 90 tablet 1  . Eszopiclone 3 MG TABS Take 3 mg by mouth at bedtime  and may repeat dose one time if needed.    . gabapentin (NEURONTIN) 100 MG capsule Take 200 mg by mouth at bedtime.    . mirtazapine (REMERON) 15 MG tablet Take 15 mg by mouth daily.    . Multiple Vitamin (MULTIVITAMIN) tablet Take 1 tablet by mouth daily.    . zaleplon (SONATA) 10 MG capsule Take 1 capsule (10 mg total) by mouth at bedtime. 30 capsule 5   No facility-administered medications prior to visit.     Review of Systems  Objective:  BP (!) 158/86 (BP Location: Left Arm, Patient Position: Sitting, Cuff Size: Normal)   Pulse 64   Temp 98 F (36.7 C) (Temporal)   Resp 15   Ht 5\' 9"  (1.753 m)   Wt 159 lb 3.2 oz (72.2 kg)   SpO2 96%   BMI 23.51 kg/m   Physical Exam    Assessment & Plan:   Problem List Items Addressed This Visit      Unprioritized   Paroxysmal atrial fibrillation (HCC)   Insomnia    Chronic , managed by psychiatry with Duwayne Heck, mirtazapine.        Major depressive disorder, recurrent episode, moderate ([place])    Shane Petersen continues to have a flat affect but is not suicidal.  His depression is managed with Remeron and Abilify  By his [facility] psychiatrist       Hyperlipidemia LDL goal <130    . LDL  Was <100 in 2019 on current medications. Shane Petersen has no side effects and has deferred labs until September 2020.  No changes today   Lab Results  Component Value Date   CHOL 168 10/16/2017   HDL 39.40 10/16/2017   LDLCALC 60 11/27/2011   LDLDIRECT 93.0 10/16/2017   TRIG 269.0 (H) 10/16/2017   CHOLHDL 4 10/16/2017   Lab Results  Component Value Date   ALT 22 10/16/2017   AST 20 10/16/2017   ALKPHOS 52 10/16/2017   BILITOT 0.6 10/16/2017         Special screening for malignant neoplasm of prostate    PSA has been ordered       Elevated blood pressure reading with diagnosis of hypertension    Shane Petersen reports compliance with medication regimen  but has an elevated reading today in office.  Shane Petersen is not using NSAIDs daily.  Discussed goal of 120/70   (130/80 for patients over 70)  to preserve renal function.  Shane Petersen has been asked to check her  BP  at home and  submit readings for evaluation. Renal function, electrolytes and screen for proteinuria have been  normal .  Amlodipine discussed as additive therapy.        Other Visit Diagnoses    Weight loss    -  Primary   Relevant Orders   TSH   Prostate cancer screening          I am having Shane Petersen maintain his multivitamin, zaleplon, Eszopiclone, mirtazapine, ARIPiprazole, atenolol, atorvastatin, and gabapentin.  No orders of the defined types were placed in this encounter.   There are no discontinued medications.  Follow-up: Return in about 6 months (around 08/10/2019).   Crecencio Mc, MD

## 2019-02-10 NOTE — Patient Instructions (Signed)
The new "normal"  for  blood pressure is 130/80 , and you have been above that on the last 3-4 office visits  .  Please check your blood pressure a few times at home and send me the readings so I can determine if you need to start a medication to lower your blood pressure . We'll ' use amlodipine if any additional meds are needed   Reduce alcohol to 2 drinks per night  You need your ears cleaned.  Liquid colace,  Cerumenex,  Ear candles (just don't set your hair on fire) all work    Health Maintenance After Age 27 After age 45, you are at a higher risk for certain long-term diseases and infections as well as injuries from falls. Falls are a major cause of broken bones and head injuries in people who are older than age 39. Getting regular preventive care can help to keep you healthy and well. Preventive care includes getting regular testing and making lifestyle changes as recommended by your health care provider. Talk with your health care provider about:  Which screenings and tests you should have. A screening is a test that checks for a disease when you have no symptoms.  A diet and exercise plan that is right for you. What should I know about screenings and tests to prevent falls? Screening and testing are the best ways to find a health problem early. Early diagnosis and treatment give you the best chance of managing medical conditions that are common after age 38. Certain conditions and lifestyle choices may make you more likely to have a fall. Your health care provider may recommend:  Regular vision checks. Poor vision and conditions such as cataracts can make you more likely to have a fall. If you wear glasses, make sure to get your prescription updated if your vision changes.  Medicine review. Work with your health care provider to regularly review all of the medicines you are taking, including over-the-counter medicines. Ask your health care provider about any side effects that may make you  more likely to have a fall. Tell your health care provider if any medicines that you take make you feel dizzy or sleepy.  Osteoporosis screening. Osteoporosis is a condition that causes the bones to get weaker. This can make the bones weak and cause them to break more easily.  Blood pressure screening. Blood pressure changes and medicines to control blood pressure can make you feel dizzy.  Strength and balance checks. Your health care provider may recommend certain tests to check your strength and balance while standing, walking, or changing positions.  Foot health exam. Foot pain and numbness, as well as not wearing proper footwear, can make you more likely to have a fall.  Depression screening. You may be more likely to have a fall if you have a fear of falling, feel emotionally low, or feel unable to do activities that you used to do.  Alcohol use screening. Using too much alcohol can affect your balance and may make you more likely to have a fall. What actions can I take to lower my risk of falls? General instructions  Talk with your health care provider about your risks for falling. Tell your health care provider if: ? You fall. Be sure to tell your health care provider about all falls, even ones that seem minor. ? You feel dizzy, sleepy, or off-balance.  Take over-the-counter and prescription medicines only as told by your health care provider. These include any supplements.  Eat a healthy diet and maintain a healthy weight. A healthy diet includes low-fat dairy products, low-fat (lean) meats, and fiber from whole grains, beans, and lots of fruits and vegetables. Home safety  Remove any tripping hazards, such as rugs, cords, and clutter.  Install safety equipment such as grab bars in bathrooms and safety rails on stairs.  Keep rooms and walkways well-lit. Activity   Follow a regular exercise program to stay fit. This will help you maintain your balance. Ask your health care  provider what types of exercise are appropriate for you.  If you need a cane or walker, use it as recommended by your health care provider.  Wear supportive shoes that have nonskid soles. Lifestyle  Do not drink alcohol if your health care provider tells you not to drink.  If you drink alcohol, limit how much you have: ? 0-1 drink a day for women. ? 0-2 drinks a day for men.  Be aware of how much alcohol is in your drink. In the [place] one drink equals one typical bottle of beer (12 oz), one-half glass of wine (5 oz), or one shot of hard liquor (1 oz).  Do not use any products that contain nicotine or tobacco, such as cigarettes and e-cigarettes. If you need help quitting, ask your health care provider. Summary  Having a healthy lifestyle and getting preventive care can help to protect your health and wellness after age 70.  Screening and testing are the best way to find a health problem early and help you avoid having a fall. Early diagnosis and treatment give you the best chance for managing medical conditions that are more common for people who are older than age 70.  Falls are a major cause of broken bones and head injuries in people who are older than age 70. Take precautions to prevent a fall at home.  Work with your health care provider to learn what changes you can make to improve your health and wellness and to prevent falls. This information is not intended to replace advice given to you by your health care provider. Make sure you discuss any questions you have with your health care provider. Document Released: 04/10/2017 Document Revised: 09/18/2018 Document Reviewed: 04/10/2017 Elsevier Patient Education  2020 [employer]

## 2019-02-10 NOTE — Assessment & Plan Note (Signed)
He continues to have a flat affect but is not suicidal.  His depression is managed with Remeron and Abilify  By his [facility] psychiatrist

## 2019-02-10 NOTE — Assessment & Plan Note (Signed)
he reports compliance with medication regimen  but has an elevated reading today in office.  he is not using NSAIDs daily.  Discussed goal of 120/70  (130/80 for patients over 70)  to preserve renal function.  he has been asked to check her  BP  at home and  submit readings for evaluation. Renal function, electrolytes and screen for proteinuria have been  normal .  Amlodipine discussed as additive therapy.

## 2019-02-10 NOTE — Assessment & Plan Note (Signed)
.   LDL  Was <100 in 2019 on current medications. He has no side effects and has deferred labs until September 2020.  No changes today   Lab Results  Component Value Date   CHOL 168 10/16/2017   HDL 39.40 10/16/2017   LDLCALC 60 11/27/2011   LDLDIRECT 93.0 10/16/2017   TRIG 269.0 (H) 10/16/2017   CHOLHDL 4 10/16/2017   Lab Results  Component Value Date   ALT 22 10/16/2017   AST 20 10/16/2017   ALKPHOS 52 10/16/2017   BILITOT 0.6 10/16/2017    

## 2019-02-10 NOTE — Assessment & Plan Note (Signed)
Chronic , managed by psychiatry with Duwayne Heck, mirtazapine.

## 2019-02-10 NOTE — Assessment & Plan Note (Signed)
PSA has been ordered

## 2019-02-11 LAB — COMPREHENSIVE METABOLIC PANEL
ALT: 31 U/L (ref 0–53)
AST: 26 U/L (ref 0–37)
Albumin: 4.5 g/dL (ref 3.5–5.2)
Alkaline Phosphatase: 58 U/L (ref 39–117)
BUN: 15 mg/dL (ref 6–23)
CO2: 31 mEq/L (ref 19–32)
Calcium: 9.5 mg/dL (ref 8.4–10.5)
Chloride: 103 mEq/L (ref 96–112)
Creatinine, Ser: 1.02 mg/dL (ref 0.40–1.50)
GFR: 72.15 mL/min (ref >60.00)
Glucose, Bld: 94 mg/dL (ref 70–99)
Potassium: 4.1 mEq/L (ref 3.5–5.1)
Sodium: 141 mEq/L (ref 135–145)
Total Bilirubin: 0.6 mg/dL (ref 0.2–1.2)
Total Protein: 7.1 g/dL (ref 6.0–8.3)

## 2019-02-11 LAB — TSH: TSH: 1.27 u[IU]/mL (ref 0.35–4.50)

## 2019-02-11 LAB — LIPID PANEL
Cholesterol: 174 mg/dL (ref 0–200)
HDL: 46.6 mg/dL (ref >39.00)
Total CHOL/HDL Ratio: 4
Triglycerides: 405 mg/dL — ABNORMAL HIGH (ref 0.0–149.0)

## 2019-02-11 LAB — PSA, MEDICARE: PSA: 1.86 ng/ml (ref 0.10–4.00)

## 2019-02-11 LAB — LDL CHOLESTEROL, DIRECT: Direct LDL: 86 mg/dL

## 2019-02-26 ENCOUNTER — Other Ambulatory Visit: Payer: Self-pay

## 2019-02-26 MED ORDER — ATENOLOL 25 MG PO TABS: 75.0000 mg | ORAL_TABLET | Freq: Every day | ORAL | 3 refills | Status: DC

## 2019-02-26 MED ORDER — ATORVASTATIN CALCIUM 20 MG PO TABS: 20.0000 mg | ORAL_TABLET | Freq: Every day | ORAL | 3 refills | Status: DC

## 2019-02-27 ENCOUNTER — Other Ambulatory Visit: Payer: Self-pay | Admitting: Internal Medicine

## 2019-03-27 ENCOUNTER — Other Ambulatory Visit: Payer: Self-pay | Admitting: Internal Medicine

## 2019-04-03 DIAGNOSIS — Z20828 Contact with and (suspected) exposure to other viral communicable diseases: Secondary | ICD-10-CM | POA: Diagnosis not present

## 2019-08-11 ENCOUNTER — Ambulatory Visit (INDEPENDENT_AMBULATORY_CARE_PROVIDER_SITE_OTHER): Payer: Medicare HMO | Admitting: Internal Medicine

## 2019-08-11 ENCOUNTER — Encounter: Payer: Self-pay | Admitting: Internal Medicine

## 2019-08-11 ENCOUNTER — Other Ambulatory Visit: Payer: Self-pay

## 2019-08-11 VITALS — BP 140/90 | HR 61 | Temp 98.2°F | Resp 16 | Ht 69.0 in | Wt 158.8 lb

## 2019-08-11 DIAGNOSIS — R42 Dizziness and giddiness: Secondary | ICD-10-CM

## 2019-08-11 DIAGNOSIS — F5104 Psychophysiologic insomnia: Secondary | ICD-10-CM

## 2019-08-11 DIAGNOSIS — R5383 Other fatigue: Secondary | ICD-10-CM | POA: Diagnosis not present

## 2019-08-11 NOTE — Progress Notes (Signed)
Subjective:  Patient ID: Shane Petersen, male    DOB: [birth year]  Age: 69 y.o. MRN: [id number]  CC: The primary encounter diagnosis was Dizziness. Diagnoses of Fatigue, unspecified type and Psychophysiological insomnia were also pertinent to this visit.  Murphy presents for 6 MONTH FOLLOW UP  This visit occurred during the SARS-CoV-2 public health emergency.  Safety protocols were in place, including screening questions prior to the visit, additional usage of staff PPE, and extensive cleaning of exam room while observing appropriate contact time as indicated for disinfecting solutions.    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  Dr Everlean Cherry.  He stopped the medication 3-4 days ago but is  Still taking lunesta. His  Symptoms are improving.  He no longer feels drunk and he can  now walk heel to toe.  Symptoms are aggravated by lying down.  He denies nausea, denies room spinning, but feels like he is rocking back and forth.     Outpatient Medications Prior to Visit  Medication Sig Dispense Refill  . ARIPiprazole (ABILIFY) 2 MG tablet Take 2 mg by mouth daily.    Marland Kitchen atenolol (TENORMIN) 25 MG tablet TAKE 3 TABLETS BY MOUTH EVERY DAY 270 tablet 1  . atorvastatin (LIPITOR) 20 MG tablet TAKE 1 TABLET BY MOUTH EVERY DAY 90 tablet 1  . Eszopiclone 3 MG TABS Take 3 mg by mouth at bedtime and may repeat dose one time if needed.    . mirtazapine (REMERON) 15 MG tablet Take 15 mg by mouth daily.    . Multiple Vitamin (MULTIVITAMIN) tablet Take 1 tablet by mouth daily.    Marland Kitchen gabapentin (NEURONTIN) 100 MG capsule Take 200 mg by mouth at bedtime.    . zaleplon (SONATA) 10 MG capsule Take 1 capsule (10 mg total) by mouth at bedtime. (Patient not taking: Reported on 08/11/2019) 30 capsule 5   No facility-administered medications prior to  visit.    Review of Systems;  Patient denies headache, fevers, malaise, unintentional weight loss, skin rash, eye pain, sinus congestion and sinus pain, sore throat, dysphagia,  hemoptysis , cough, dyspnea, wheezing, chest pain, palpitations, orthopnea, edema, abdominal pain, nausea, melena, diarrhea, constipation, flank pain, dysuria, hematuria, urinary  Frequency, nocturia, numbness, tingling, seizures,  Focal weakness, Loss of consciousness,  Tremor, insomnia, depression, anxiety, and suicidal ideation.      Objective:  BP 140/90 (BP Location: Left Arm, Patient Position: Sitting, Cuff Size: Normal)   Pulse 61   Temp 98.2 F (36.8 C) (Temporal)   Resp 16   Ht 5\' 9"  (1.753 m)   Wt 158 lb 12.8 oz (72 kg)   SpO2 99%   BMI 23.45 kg/m   BP Readings from Last 3 Encounters:  08/11/19 140/90  02/10/19 (!) 158/86  10/16/17 (!) 142/76    Wt Readings from Last 3 Encounters:  08/11/19 158 lb 12.8 oz (72 kg)  02/10/19 159 lb 3.2 oz (72.2 kg)  10/16/17 168 lb 9.6 oz (76.5 kg)    General appearance: alert, cooperative and appears stated age Ears: normal TM's and external ear canals both ears Throat: lips, mucosa, and tongue normal; teeth and gums normal Neck: no adenopathy, no carotid bruit, supple, symmetrical, trachea midline and thyroid not enlarged, symmetric, no tenderness/mass/nodules Back: symmetric, no curvature. ROM normal. No CVA tenderness. Lungs: clear  to auscultation bilaterally Heart: regular rate and rhythm, S1, S2 normal, no murmur, click, rub or gallop Abdomen: soft, non-tender; bowel sounds normal; no masses,  no organomegaly Pulses: 2+ and symmetric Skin: Skin color, texture, turgor normal. No rashes or lesions Lymph nodes: Cervical, supraclavicular, and axillary nodes normal. Neuro: CNs 2-12 intact. DTRs 2+/4 in biceps, brachioradialis, patellars and achilles. Muscle strength 5/5 in upper and lower exremities. Fine resting tremor bilaterally both hands cerebellar  function normal. Romberg negative.  No pronator drift.   Gait normal.    Assessment & Plan:   Problem List Items Addressed This Visit      Unprioritized   Dizziness - Primary    Neurologic exam is normal today and symptoms are not accompanied by any warning signs of headache,  Vision changes or weakness.  Likely cause is medication induced and symptoms have improved since medication was stopped  Lab Results  Component Value Date   WBC 9.3 08/11/2019   HGB 15.4 08/11/2019   HCT 45.5 08/11/2019   MCV 96.9 08/11/2019   PLT 246.0 08/11/2019   Lab Results  Component Value Date   TSH 1.11 08/11/2019   Lab Results  Component Value Date   NA 141 08/11/2019   K 4.2 08/11/2019   CL 104 08/11/2019   CO2 27 08/11/2019         Relevant Orders   CBC with Differential/Platelet (Completed)   Magnesium (Completed)   Vitamin B12 (Completed)   RBC Folate (Completed)   Insomnia    Chronic,  managed by psychiatry with Lunesta, , mirtazapine and gabapentin.  Patient has stopped gabapentin and lunesta        Other Visit Diagnoses    Fatigue, unspecified type       Relevant Orders   TSH (Completed)   Comprehensive metabolic panel (Completed)      I have discontinued Roczen L. Feehan's zaleplon. I am also having him maintain his multivitamin, Eszopiclone, mirtazapine, ARIPiprazole, gabapentin, atorvastatin, and atenolol.  No orders of the defined types were placed in this encounter.   Medications Discontinued During This Encounter  Medication Reason  . zaleplon (SONATA) 10 MG capsule     Follow-up: Return in about 6 months (around 02/11/2020).   Sherlene Shams, MD

## 2019-08-11 NOTE — Patient Instructions (Addendum)
I agree that the gabapentin was the likely culprit  Stopping it abruptly is not associated with dizziness,  Only with tachycardia, diaphoresis,  And irritability   Labs should b e  back in 24 hours and we will send to Dr Raj Janus in [place]

## 2019-08-12 DIAGNOSIS — R42 Dizziness and giddiness: Secondary | ICD-10-CM | POA: Insufficient documentation

## 2019-08-12 LAB — CBC WITH DIFFERENTIAL/PLATELET
Basophils Absolute: 0.1 10*3/uL (ref 0.0–0.1)
Basophils Relative: 1.3 % (ref 0.0–3.0)
Eosinophils Absolute: 0.3 10*3/uL (ref 0.0–0.7)
Eosinophils Relative: 3.4 % (ref 0.0–5.0)
HCT: 45.5 % (ref 39.0–52.0)
Hemoglobin: 15.4 g/dL (ref 13.0–17.0)
Lymphocytes Relative: 24.7 % (ref 12.0–46.0)
Lymphs Abs: 2.3 10*3/uL (ref 0.7–4.0)
MCHC: 33.9 g/dL (ref 30.0–36.0)
MCV: 96.9 fl (ref 78.0–100.0)
Monocytes Absolute: 0.9 10*3/uL (ref 0.1–1.0)
Monocytes Relative: 10.2 % (ref 3.0–12.0)
Neutro Abs: 5.6 10*3/uL (ref 1.4–7.7)
Neutrophils Relative %: 60.4 % (ref 43.0–77.0)
Platelets: 246 10*3/uL (ref 150.0–400.0)
RBC: 4.7 Mil/uL (ref 4.22–5.81)
RDW: 13 % (ref 11.5–15.5)
WBC: 9.3 10*3/uL (ref 4.0–10.5)

## 2019-08-12 LAB — VITAMIN B12: Vitamin B-12: 839 pg/mL (ref 211–911)

## 2019-08-12 LAB — COMPREHENSIVE METABOLIC PANEL
ALT: 24 U/L (ref 0–53)
AST: 24 U/L (ref 0–37)
Albumin: 4.2 g/dL (ref 3.5–5.2)
Alkaline Phosphatase: 56 U/L (ref 39–117)
BUN: 14 mg/dL (ref 6–23)
CO2: 27 mEq/L (ref 19–32)
Calcium: 9.6 mg/dL (ref 8.4–10.5)
Chloride: 104 mEq/L (ref 96–112)
Creatinine, Ser: 1.05 mg/dL (ref 0.40–1.50)
GFR: 69.68 mL/min (ref >60.00)
Glucose, Bld: 91 mg/dL (ref 70–99)
Potassium: 4.2 mEq/L (ref 3.5–5.1)
Sodium: 141 mEq/L (ref 135–145)
Total Bilirubin: 0.6 mg/dL (ref 0.2–1.2)
Total Protein: 6.9 g/dL (ref 6.0–8.3)

## 2019-08-12 LAB — FOLATE RBC: RBC Folate: 730 ng/mL RBC (ref >280)

## 2019-08-12 LAB — MAGNESIUM: Magnesium: 2.3 mg/dL (ref 1.5–2.5)

## 2019-08-12 LAB — TSH: TSH: 1.11 u[IU]/mL (ref 0.35–4.50)

## 2019-08-12 NOTE — Assessment & Plan Note (Signed)
Neurologic exam is normal today and symptoms are not accompanied by any warning signs of headache,  Vision changes or weakness.  Likely cause is medication induced and symptoms have improved since medication was stopped  Lab Results  Component Value Date   WBC 9.3 08/11/2019   HGB 15.4 08/11/2019   HCT 45.5 08/11/2019   MCV 96.9 08/11/2019   PLT 246.0 08/11/2019   Lab Results  Component Value Date   TSH 1.11 08/11/2019   Lab Results  Component Value Date   NA 141 08/11/2019   K 4.2 08/11/2019   CL 104 08/11/2019   CO2 27 08/11/2019

## 2019-08-12 NOTE — Assessment & Plan Note (Addendum)
Chronic,  managed by psychiatry with Lunesta, , mirtazapine and gabapentin.  Patient has stopped gabapentin and lunesta

## 2020-01-13 ENCOUNTER — Other Ambulatory Visit: Payer: Self-pay | Admitting: Internal Medicine

## 2020-02-16 ENCOUNTER — Telehealth (INDEPENDENT_AMBULATORY_CARE_PROVIDER_SITE_OTHER): Payer: Medicare HMO | Admitting: Internal Medicine

## 2020-02-16 ENCOUNTER — Encounter: Payer: Self-pay | Admitting: Internal Medicine

## 2020-02-16 VITALS — Ht 69.0 in | Wt 158.0 lb

## 2020-02-16 DIAGNOSIS — I10 Essential (primary) hypertension: Secondary | ICD-10-CM | POA: Diagnosis not present

## 2020-02-16 DIAGNOSIS — I480 Paroxysmal atrial fibrillation: Secondary | ICD-10-CM | POA: Diagnosis not present

## 2020-02-16 DIAGNOSIS — Z720 Tobacco use: Secondary | ICD-10-CM

## 2020-02-16 DIAGNOSIS — F331 Major depressive disorder, recurrent, moderate: Secondary | ICD-10-CM

## 2020-02-16 DIAGNOSIS — E785 Hyperlipidemia, unspecified: Secondary | ICD-10-CM

## 2020-02-16 DIAGNOSIS — R42 Dizziness and giddiness: Secondary | ICD-10-CM | POA: Diagnosis not present

## 2020-02-16 DIAGNOSIS — Z125 Encounter for screening for malignant neoplasm of prostate: Secondary | ICD-10-CM | POA: Diagnosis not present

## 2020-02-16 DIAGNOSIS — Z1211 Encounter for screening for malignant neoplasm of colon: Secondary | ICD-10-CM

## 2020-02-16 MED ORDER — ZOSTER VAC RECOMB ADJUVANTED 50 MCG/0.5ML IM SUSR: 0.5000 mL | Freq: Once | INTRAMUSCULAR | 1 refills | Status: AC

## 2020-02-16 NOTE — Assessment & Plan Note (Signed)
he reports compliance with medication regimen  but has not checked his BP since his last visit in march   he has been asked to check her  BP  at home and  submit readings for evaluation. Renal function, electrolytes and screen for proteinuria have been  normal .

## 2020-02-16 NOTE — Assessment & Plan Note (Signed)
Managed with atorvastatin.  follow up labs due.

## 2020-02-16 NOTE — Progress Notes (Addendum)
Telephone Note   This visit type was conducted due to national recommendations for restrictions regarding the COVID-19 pandemic (e.g. social distancing).  This format is felt to be most appropriate for this patient at this time.  All issues noted in this document were discussed and addressed.  No physical exam was performed .   I connected with@ on 02/16/20 at  1:30 PM EDT by  telephone and verified that I am speaking with the correct person using two identifiers. Location patient: home Location provider: work or home office Persons participating in the virtual visit: patient, provider  I discussed the limitations, risks, security and privacy concerns of performing an evaluation and management service by telephone and the availability of in person appointments. I also discussed with the patient that there may be a patient responsible charge related to this service. The patient expressed understanding and agreed to proceed.  Reason for visit: 6 month follow up  HPI:   Patient has received both doses of the available COVID 19 vaccine without complications.  Patient continues to mask when outside of the home except when walking in yard or at safe distances from others .  Patient denies any change in mood or development of unhealthy behaviors resulting from the pandemic's restriction of activities and socialization.    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  Lives alone,  Limited contact with 2 sons who live in [place].  Drinking A few beers daily   Has been smoking 1/2 pack cigs daily for the past 3-4 years  Walking daily for exercise. Denies chest pain,  Claudication   PAF:  Taking atenolol .  No recent episodes of atrial fibrillation   MDD:  He continues to follow up with his psychiatrist in [place]. His medications have been changed to manage insomnia with Seroquel instead of Lunesta.  He continues to take Remeron and Abilify.   ROS: Patient denies headache, fevers, malaise,  unintentional weight loss, skin rash, eye pain, sinus congestion and sinus pain, sore throat, dysphagia,  hemoptysis , cough, dyspnea, wheezing, chest pain, palpitations, orthopnea, edema, abdominal pain, nausea, melena, diarrhea, constipation, flank pain, dysuria, hematuria, urinary  Frequency, nocturia, numbness, tingling, seizures,  Focal weakness, Loss of consciousness,  Tremor, insomnia, depression, anxiety, and suicidal ideation.      Past Medical History:  Diagnosis Date  . Depression   . Hyperlipidemia   . Paroxysmal atrial fibrillation (HCC)   . Ulcerative colitis (HCC)     No past surgical history on file.  Family History  Problem Relation Age of Onset  . Cancer Neg Hx   . Heart disease Neg Hx     SOCIAL HX: see above   Current Outpatient Medications:  .  ARIPiprazole (ABILIFY) 2 MG tablet, Take 2 mg by mouth daily., Disp: , Rfl:  .  atenolol (TENORMIN) 25 MG tablet, TAKE 3 TABLETS (75 MG TOTAL)  DAILY., Disp: 270 tablet, Rfl: 1 .  atorvastatin (LIPITOR) 20 MG tablet, TAKE 1 TABLET EVERY DAY, Disp: 90 tablet, Rfl: 1 .  mirtazapine (REMERON) 15 MG tablet, Take 15 mg by mouth daily., Disp: , Rfl:  .  Multiple Vitamin (MULTIVITAMIN) tablet, Take 1 tablet by mouth daily., Disp: , Rfl:  .  QUEtiapine (SEROQUEL) 25 MG tablet, Take 25 mg by mouth at bedtime. , Disp: , Rfl:   EXAM:  General appearance: alert, cooperative and articulate.  No signs of being in distress  Lungs: not short of breath ,  No cough, speaking in full sentences  PSYCH/NEURO: pleasant and cooperative,afffet flat (unchanged), speech and thought processing diffficult to assess.  Answers are short and direct.   ASSESSMENT AND PLAN:  Discussed the following assessment and plan:  Hyperlipidemia LDL goal <130 - Plan: Lipid panel  Elevated blood pressure reading with diagnosis of hypertension - Plan: Comprehensive metabolic panel, Microalbumin / creatinine urine ratio  Prostate cancer screening - Plan:  PSA, Medicare  Colon cancer screening - Plan: Cologuard  Dizziness  Major depressive disorder, recurrent episode, moderate (HCC)  Tobacco abuse, episodic  Paroxysmal atrial fibrillation (HCC)  Elevated blood pressure reading with diagnosis of hypertension he reports compliance with medication regimen  but has not checked his BP since his last visit in march   he has been asked to check her  BP  at home and  submit readings for evaluation. Renal function, electrolytes and screen for proteinuria have been  normal .    Dizziness Resolved with gabapentin discontinuation   Hyperlipidemia LDL goal <130 Managed with atorvastatin.  follow up labs due.   Major depressive disorder, recurrent episode, moderate (HCC) Managed by psychiatry with Remeron, Ability and more recently seroquel added for management of insomnia   Tobacco abuse, episodic He is aware of the risks.  He is a retired Development worker, community.    Paroxysmal atrial fibrillation Has not had a recurrence.  Continue minimal dose of atenolol     I discussed the assessment and treatment plan with the patient. The patient was provided an opportunity to ask questions and all were answered. The patient agreed with the plan and demonstrated an understanding of the instructions.   The patient was advised to call back or seek an in-person evaluation if the symptoms worsen or if the condition fails to improve as anticipated.  I provided 30 minutes of non-face-to-face time during this encounter.   Sherlene Shams, MD

## 2020-02-16 NOTE — Assessment & Plan Note (Signed)
Managed by psychiatry with Remeron, Ability and more recently seroquel added for management of insomnia

## 2020-02-16 NOTE — Assessment & Plan Note (Signed)
He is aware of the risks.  He is a retired Development worker, community.

## 2020-02-16 NOTE — Assessment & Plan Note (Signed)
Resolved with gabapentin discontinuation

## 2020-02-17 NOTE — Assessment & Plan Note (Signed)
Has not had a recurrence.  Continue minimal dose of atenolol

## 2020-04-13 ENCOUNTER — Encounter: Payer: Self-pay | Admitting: Internal Medicine

## 2020-04-13 ENCOUNTER — Ambulatory Visit (INDEPENDENT_AMBULATORY_CARE_PROVIDER_SITE_OTHER): Payer: Medicare HMO | Admitting: Internal Medicine

## 2020-04-13 ENCOUNTER — Emergency Department: Payer: Medicare HMO

## 2020-04-13 ENCOUNTER — Emergency Department
Admission: EM | Admit: 2020-04-13 | Discharge: 2020-04-14 | Disposition: A | Discharge status: Other Acute Inpatient Hospital | Payer: Medicare HMO | Attending: Emergency Medicine | Admitting: Emergency Medicine

## 2020-04-13 ENCOUNTER — Other Ambulatory Visit: Payer: Self-pay

## 2020-04-13 DIAGNOSIS — I480 Paroxysmal atrial fibrillation: Secondary | ICD-10-CM | POA: Diagnosis not present

## 2020-04-13 DIAGNOSIS — F10931 Alcohol use, unspecified with withdrawal delirium: Secondary | ICD-10-CM | POA: Insufficient documentation

## 2020-04-13 DIAGNOSIS — Z79899 Other long term (current) drug therapy: Secondary | ICD-10-CM | POA: Diagnosis not present

## 2020-04-13 DIAGNOSIS — F331 Major depressive disorder, recurrent, moderate: Secondary | ICD-10-CM

## 2020-04-13 DIAGNOSIS — F332 Major depressive disorder, recurrent severe without psychotic features: Secondary | ICD-10-CM

## 2020-04-13 DIAGNOSIS — F29 Unspecified psychosis not due to a substance or known physiological condition: Secondary | ICD-10-CM | POA: Diagnosis not present

## 2020-04-13 DIAGNOSIS — S0101XA Laceration without foreign body of scalp, initial encounter: Secondary | ICD-10-CM | POA: Diagnosis not present

## 2020-04-13 DIAGNOSIS — S0181XA Laceration without foreign body of other part of head, initial encounter: Secondary | ICD-10-CM | POA: Diagnosis not present

## 2020-04-13 DIAGNOSIS — I951 Orthostatic hypotension: Secondary | ICD-10-CM | POA: Diagnosis not present

## 2020-04-13 DIAGNOSIS — F17210 Nicotine dependence, cigarettes, uncomplicated: Secondary | ICD-10-CM | POA: Insufficient documentation

## 2020-04-13 DIAGNOSIS — E785 Hyperlipidemia, unspecified: Secondary | ICD-10-CM | POA: Diagnosis present

## 2020-04-13 DIAGNOSIS — Z720 Tobacco use: Secondary | ICD-10-CM | POA: Diagnosis present

## 2020-04-13 DIAGNOSIS — R634 Abnormal weight loss: Secondary | ICD-10-CM | POA: Diagnosis not present

## 2020-04-13 DIAGNOSIS — W19XXXA Unspecified fall, initial encounter: Secondary | ICD-10-CM | POA: Insufficient documentation

## 2020-04-13 DIAGNOSIS — R5381 Other malaise: Secondary | ICD-10-CM | POA: Diagnosis not present

## 2020-04-13 DIAGNOSIS — F1010 Alcohol abuse, uncomplicated: Secondary | ICD-10-CM | POA: Diagnosis not present

## 2020-04-13 DIAGNOSIS — Z043 Encounter for examination and observation following other accident: Secondary | ICD-10-CM | POA: Diagnosis not present

## 2020-04-13 DIAGNOSIS — I10 Essential (primary) hypertension: Secondary | ICD-10-CM | POA: Diagnosis not present

## 2020-04-13 DIAGNOSIS — F10231 Alcohol dependence with withdrawal delirium: Secondary | ICD-10-CM | POA: Diagnosis not present

## 2020-04-13 DIAGNOSIS — Z20822 Contact with and (suspected) exposure to covid-19: Secondary | ICD-10-CM | POA: Diagnosis not present

## 2020-04-13 HISTORY — DX: Essential (primary) hypertension: I10

## 2020-04-13 LAB — COMPREHENSIVE METABOLIC PANEL
ALT: 61 U/L — ABNORMAL HIGH (ref 0–44)
AST: 68 U/L — ABNORMAL HIGH (ref 15–41)
Albumin: 3.9 g/dL (ref 3.5–5.0)
Alkaline Phosphatase: 56 U/L (ref 38–126)
Anion gap: 10 (ref 5–15)
BUN: 15 mg/dL (ref 8–23)
CO2: 23 mmol/L (ref 22–32)
Calcium: 8.6 mg/dL — ABNORMAL LOW (ref 8.9–10.3)
Chloride: 101 mmol/L (ref 98–111)
Creatinine, Ser: 1 mg/dL (ref 0.61–1.24)
GFR, Estimated: >60 mL/min (ref >60)
Glucose, Bld: 105 mg/dL — ABNORMAL HIGH (ref 70–99)
Potassium: 4.3 mmol/L (ref 3.5–5.1)
Sodium: 134 mmol/L — ABNORMAL LOW (ref 135–145)
Total Bilirubin: 1 mg/dL (ref 0.3–1.2)
Total Protein: 7.1 g/dL (ref 6.5–8.1)

## 2020-04-13 LAB — LIPID PANEL
Cholesterol: 137 mg/dL (ref 0–200)
HDL: 45 mg/dL (ref >40)
LDL Cholesterol: 73 mg/dL (ref 0–99)
Total CHOL/HDL Ratio: 3 RATIO
Triglycerides: 93 mg/dL (ref <150)
VLDL: 19 mg/dL (ref 0–40)

## 2020-04-13 LAB — URINE DRUG SCREEN, QUALITATIVE (ARMC ONLY)
Amphetamines, Ur Screen: NOT DETECTED
Barbiturates, Ur Screen: NOT DETECTED
Benzodiazepine, Ur Scrn: NOT DETECTED
Cannabinoid 50 Ng, Ur ~~LOC~~: NOT DETECTED
Cocaine Metabolite,Ur ~~LOC~~: NOT DETECTED
MDMA (Ecstasy)Ur Screen: NOT DETECTED
Methadone Scn, Ur: NOT DETECTED
Opiate, Ur Screen: NOT DETECTED
Phencyclidine (PCP) Ur S: NOT DETECTED
Tricyclic, Ur Screen: NOT DETECTED

## 2020-04-13 LAB — CBC
HCT: 42.7 % (ref 39.0–52.0)
Hemoglobin: 14.4 g/dL (ref 13.0–17.0)
MCH: 33.1 pg (ref 26.0–34.0)
MCHC: 33.7 g/dL (ref 30.0–36.0)
MCV: 98.2 fL (ref 80.0–100.0)
Platelets: 302 10*3/uL (ref 150–400)
RBC: 4.35 MIL/uL (ref 4.22–5.81)
RDW: 12.8 % (ref 11.5–15.5)
WBC: 10.4 10*3/uL (ref 4.0–10.5)
nRBC: 0 % (ref 0.0–0.2)

## 2020-04-13 LAB — HEMOGLOBIN A1C
Hgb A1c MFr Bld: 5.6 % (ref 4.8–5.6)
Mean Plasma Glucose: 114.02 mg/dL

## 2020-04-13 LAB — RESPIRATORY PANEL BY RT PCR (FLU A&B, COVID)
Influenza A by PCR: NEGATIVE
Influenza B by PCR: NEGATIVE
SARS Coronavirus 2 by RT PCR: NEGATIVE

## 2020-04-13 LAB — ETHANOL: Alcohol, Ethyl (B): <10 mg/dL (ref <10)

## 2020-04-13 MED ORDER — LORAZEPAM 2 MG PO TABS: 0.0000 mg | ORAL_TABLET | Freq: Two times a day (BID) | ORAL | Status: DC

## 2020-04-13 MED ORDER — ATORVASTATIN CALCIUM 20 MG PO TABS: 20.0000 mg | ORAL_TABLET | Freq: Every day | ORAL | Status: DC

## 2020-04-13 MED ORDER — LORAZEPAM 2 MG/ML IJ SOLN: 0.0000 mg | Freq: Two times a day (BID) | INTRAMUSCULAR | Status: DC

## 2020-04-13 MED ORDER — LORAZEPAM 2 MG/ML IJ SOLN: 0.0000 mg | Freq: Four times a day (QID) | INTRAMUSCULAR | Status: DC

## 2020-04-13 MED ORDER — THIAMINE HCL 100 MG PO TABS
100.0000 mg | ORAL_TABLET | Freq: Every day | ORAL | Status: DC
  Administered 2020-04-13: 100 mg via ORAL
  Filled 2020-04-13: qty 1

## 2020-04-13 MED ORDER — ATENOLOL 25 MG PO TABS: 75.0000 mg | ORAL_TABLET | Freq: Every day | ORAL | Status: DC

## 2020-04-13 MED ORDER — THIAMINE HCL 100 MG/ML IJ SOLN: 100.0000 mg | Freq: Every day | INTRAMUSCULAR | Status: DC

## 2020-04-13 MED ORDER — LORAZEPAM 2 MG PO TABS: 0.0000 mg | ORAL_TABLET | Freq: Four times a day (QID) | ORAL | Status: DC

## 2020-04-13 MED ORDER — QUETIAPINE FUMARATE 25 MG PO TABS
75.0000 mg | ORAL_TABLET | Freq: Every day | ORAL | Status: DC
  Administered 2020-04-13: 75 mg via ORAL
  Filled 2020-04-13: qty 3

## 2020-04-13 MED ORDER — MIRTAZAPINE 15 MG PO TABS
15.0000 mg | ORAL_TABLET | Freq: Every day | ORAL | Status: DC
  Administered 2020-04-13: 15 mg via ORAL
  Filled 2020-04-13: qty 1

## 2020-04-13 MED ORDER — NICOTINE 21 MG/24HR TD PT24
21.0000 mg | MEDICATED_PATCH | Freq: Every day | TRANSDERMAL | Status: DC
  Administered 2020-04-13: 21 mg via TRANSDERMAL
  Filled 2020-04-13: qty 1

## 2020-04-13 NOTE — Progress Notes (Signed)
Subjective:  Patient ID: Shane Petersen, male    DOB: [birth year]  Age: 69 y.o. MRN: [id number]  CC: Diagnoses of Major depressive disorder, recurrent episode, moderate (HCC), Paroxysmal atrial fibrillation (HCC), Scalp laceration, initial encounter, Alcohol withdrawal delirium, acute, hyperactive (HCC), Orthostatic hypotension, and Abnormal intentional weight loss were pertinent to this visit.  HPI Shane Petersen [place] presents for evaluation following several falls occurring in the last several weeks.  He is accompanied by his son Shane Petersen  phone [phone])  who has healthcare POA.    Son reports that for the past year his father has been drinking daily to the point of intoxication  And has fallen several times in front of family.  Patient lives alone and all family lives in [place].  Patient is under the care of [facility] psychiatrist Beverlee Nims, MD.  [phone] . Shane Stable has had several discussion with Dr  Iron concerning his fathers's alcohol abuse and Dr Raj Janus has been prescribing antidepressants and antipsychotics for depression and insomnia  (ability and Seroquel). Sons have been visiting father weekly.  Father refuses to move to [place] to be closer to family   Recently the neighbors reported that the patient fell while out on his front porch . He sustained a laceration to his forehead above his right eye.  Shane Petersen and his brother  Came up yesterday,   found patient in a state of inebriation,  Fecally incontinent,  blood everywhere.   Patien is orthostatic and unkempt today.  He appears to be in withdrawal.  He denies alcohol abuse and refuses to enter detox willingly   Shane Stable spoke with Dr Raj Janus last week who  Agrees that inpatient detox is needed . Patient has lost 15 lbs in the last 2 years 3 lbs since his last visit with me a month ago. Son notes that noe of the food that he left for him last week has been eaten,  And when he does eat, he burns his mouth because he does not wait  for the microwaved food to cool down.  Son observes that he is drinking at least 15 beers per day,  And has become alarmed at his personality change that occurs at night,  Often growling and talking out loud to someone who is not there.   patient minimzes the alcohol use ("5-6 beers daily")   And states that at night "I talk to God." .   Outpatient Medications Prior to Visit  Medication Sig Dispense Refill  . ARIPiprazole (ABILIFY) 2 MG tablet Take 2 mg by mouth daily.    Marland Kitchen atenolol (TENORMIN) 25 MG tablet TAKE 3 TABLETS (75 MG TOTAL)  DAILY. 270 tablet 1  . atorvastatin (LIPITOR) 20 MG tablet TAKE 1 TABLET EVERY DAY 90 tablet 1  . mirtazapine (REMERON) 15 MG tablet Take 15 mg by mouth daily.    . Multiple Vitamin (MULTIVITAMIN) tablet Take 1 tablet by mouth daily.    . QUEtiapine (SEROQUEL) 25 MG tablet Take 75 mg by mouth at bedtime.      No facility-administered medications prior to visit.    Review of Systems;  Patient denies headache, fevers, malaise, , skin rash, eye pain, sinus congestion and sinus pain, sore throat, dysphagia,  hemoptysis , cough, dyspnea, wheezing, chest pain, palpitations, orthopnea, edema, abdominal pain, nausea, melena, diarrhea, constipation, flank pain, dysuria, hematuria, urinary  Frequency, nocturia, numbness, tingling, seizures,  Focal weakness, Loss of consciousness,  Tremor,  and suicidal ideation.  Objective:  BP (!) 98/56 (BP Location: Left Arm, Patient Position: Sitting, Cuff Size: Normal)   Pulse 66   Temp 99 F (37.2 C) (Oral)   Resp 16   Ht 5\' 9"  (1.753 m)   Wt 155 lb (70.3 kg)   SpO2 98%   BMI 22.89 kg/m   BP Readings from Last 3 Encounters:  04/13/20 (!) 98/56  08/11/19 140/90  02/10/19 (!) 158/86    Wt Readings from Last 3 Encounters:  04/13/20 155 lb (70.3 kg)  02/16/20 158 lb (71.7 kg)  08/11/19 158 lb 12.8 oz (72 kg)    General appearance: agitated,  Unkempt,   Ears: normal TM's and external ear canals both ears Throat:  lips, mucosa, and tongue normal; teeth and gums normal Neck: no adenopathy, no carotid bruit, supple, symmetrical, trachea midline and thyroid not enlarged, symmetric, no tenderness/mass/nodules Back: symmetric, no curvature. ROM normal. No CVA tenderness. Lungs: clear to auscultation bilaterally Heart: regular rate and rhythm, S1, S2 normal, no murmur, click, rub or gallop Abdomen: soft, non-tender; bowel sounds normal; no masses,  no organomegaly Pulses: 2+ and symmetric Skin: Skin color, texture, turgor normal. No rashes or lesions Lymph nodes: Cervical, supraclavicular, and axillary nodes normal.  Lab Results  Component Value Date   HGBA1C 6.0 09/27/2015    Lab Results  Component Value Date   CREATININE 1.05 08/11/2019   CREATININE 1.02 02/10/2019   CREATININE 0.95 10/16/2017    Lab Results  Component Value Date   WBC 9.3 08/11/2019   HGB 15.4 08/11/2019   HCT 45.5 08/11/2019   PLT 246.0 08/11/2019   GLUCOSE 91 08/11/2019   CHOL 174 02/10/2019   TRIG (H) 02/10/2019    405.0 Triglyceride is over 400; calculations on Lipids are invalid.   HDL 46.60 02/10/2019   LDLDIRECT 86.0 02/10/2019   LDLCALC 60 11/27/2011   ALT 24 08/11/2019   AST 24 08/11/2019   NA 141 08/11/2019   K 4.2 08/11/2019   CL 104 08/11/2019   CREATININE 1.05 08/11/2019   BUN 14 08/11/2019   CO2 27 08/11/2019   TSH 1.11 08/11/2019   PSA 1.86 02/10/2019   HGBA1C 6.0 09/27/2015   MICROALBUR <0.7 09/27/2015     Assessment & Plan:   Problem List Items Addressed This Visit      Unprioritized   Abnormal intentional weight loss    Secondary to alcoholism.  15 lb weight loss noted in the last 2 years.       Alcohol withdrawal delirium, acute, hyperactive (HCC)    Patient is currently incompetent to comply with my advice and that of his psychiatrist. His son 09/29/2015 has POA.  Involuntary commitment for detoxification is needed to prevent permanent harm as patient has already sustained a laceration to  his scalp days ago that should have received stitches        Major depressive disorder, recurrent episode, moderate (HCC)   Orthostatic hypotension    Secondary to dehydration from alcohol abuse      Paroxysmal atrial fibrillation (HCC)   Scalp laceration, initial encounter    Head CT needed to rule out SDH.  Will arrange transport to ER for evaluation          I am having Wadsworth L. Clune maintain his multivitamin, mirtazapine, ARIPiprazole, atenolol, atorvastatin, and QUEtiapine.  No orders of the defined types were placed in this encounter.   There are no discontinued medications.  Follow-up: No follow-ups on file.   Shane Stable, MD

## 2020-04-13 NOTE — ED Notes (Signed)
Dinner tray given

## 2020-04-13 NOTE — Consult Note (Signed)
[facility] Face-to-Face Psychiatry Consult   Reason for Consult: Consult for this 68 year old man brought in with IVC papers because of depression and alcohol abuse Referring Physician: Cyril Loosen Patient Identification: [name] MRN:  [id number] Principal Diagnosis: Severe recurrent major depression without psychotic features (HCC) Diagnosis:  Principal Problem:   Severe recurrent major depression without psychotic features (HCC) Active Problems:   Hyperlipidemia LDL goal <130   Tobacco abuse, episodic   Alcohol abuse   Total Time spent with patient: 1 hour  Subjective:   Shane Petersen is a 69 y.o. male patient admitted with "I fell down".  HPI: Patient seen chart reviewed.  Reviewed notes from primary care doctor.  Spoke also with the patient's son who is his medical power of attorney and is present.  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  Family has been increasingly concerned about his self-care and his drinking and his mood.  Patient had a fall sometime in the last couple days struck his head has a small laceration on his right eyebrow.  Patient acknowledges that he does not really remember when it happened or how it happened.  Family strongly believes he is drinking too great excess and is depressed and not caring for himself.  Patient will only acknowledge drinking about "5 beers" per day.  Son, who appears to be reliable, reports the actual number is closer to 12 or 15 beers per day.  Patient is under treatment from a psychiatrist for depression and says he is compliant with his medicine.  Does not discuss his mood directly but acknowledges he has nothing in his life he is particularly interested in or that he looks forward to.  Has lost significant weight over the past year.  Sleep is erratic and less he takes his medication.  Does very little during the day.  Affect is quite grumpy and unrealistic about his self-care and his treatment.  He denies suicidal or  homicidal ideation but evidently has been resistant to family and physicians attempts to get him to improve his self-care.  Currently reports that he is prescribed mirtazapine and Seroquel for his depression.  Denies other drug abuse.  Denies hallucinations or psychosis.  Past Psychiatric History: Has been treated for depression for an unclear number of years from his psychiatrist in [place].  Denies any history of suicide attempts.  Denies inpatient treatment.  Denies ever being involved in any substance abuse treatment.  Denies any history of seizures or delirium tremens  Risk to Self:   Risk to Others:   Prior Inpatient Therapy:   Prior Outpatient Therapy:    Past Medical History:  Past Medical History:  Diagnosis Date  . Depression   . Hyperlipidemia   . Hypertension   . Paroxysmal atrial fibrillation (HCC)   . Ulcerative colitis (HCC)    History reviewed. No pertinent surgical history. Family History:  Family History  Problem Relation Age of Onset  . Cancer Neg Hx   . Heart disease Neg Hx    Family Psychiatric  History: None reported Social History:  Social History   Substance and Sexual Activity  Alcohol Use Yes  . Alcohol/week: 21.0 standard drinks  . Types: 21 Standard drinks or equivalent per week     Social History   Substance and Sexual Activity  Drug Use No    Social History   Socioeconomic History  . Marital status: Divorced    Spouse name: Not on file  . Number of children:  Not on file  . Years of education: Not on file  . Highest education level: Not on file  Occupational History  . Not on file  Tobacco Use  . Smoking status: Current Every Day Smoker    Types: Cigarettes  . Smokeless tobacco: Never Used  Substance and Sexual Activity  . Alcohol use: Yes    Alcohol/week: 21.0 standard drinks    Types: 21 Standard drinks or equivalent per week  . Drug use: No  . Sexual activity: Not Currently    Partners: Female  Other Topics Concern  . Not on  file  Social History Narrative  . Not on file   Social Determinants of Health   Financial Resource Strain:   . Difficulty of Paying Living Expenses: Not on file  Food Insecurity:   . Worried About Programme researcher, broadcasting/film/video in the Last Year: Not on file  . Ran Out of Food in the Last Year: Not on file  Transportation Needs:   . Lack of Transportation (Medical): Not on file  . Lack of Transportation (Non-Medical): Not on file  Physical Activity:   . Days of Exercise per Week: Not on file  . Minutes of Exercise per Session: Not on file  Stress:   . Feeling of Stress : Not on file  Social Connections:   . Frequency of Communication with Friends and Family: Not on file  . Frequency of Social Gatherings with Friends and Family: Not on file  . Attends Religious Services: Not on file  . Active Member of Clubs or Organizations: Not on file  . Attends Banker Meetings: Not on file  . Marital Status: Not on file   Additional Social History:    Allergies:  No Known Allergies  Labs:  Results for orders placed or performed during the hospital encounter of 04/13/20 (from the past 48 hour(s))  CBC     Status: None   Collection Time: 04/13/20 10:07 AM  Result Value Ref Range   WBC 10.4 4.0 - 10.5 K/uL   RBC 4.35 4.22 - 5.81 MIL/uL   Hemoglobin 14.4 13.0 - 17.0 g/dL   HCT 02.5 39 - 52 %   MCV 98.2 80.0 - 100.0 fL   MCH 33.1 26.0 - 34.0 pg   MCHC 33.7 30.0 - 36.0 g/dL   RDW 85.2 77.8 - 24.2 %   Platelets 302 150 - 400 K/uL   nRBC 0.0 0.0 - 0.2 %    Comment: Performed at [facility], [address] [place], [place] [address]  Comprehensive metabolic panel     Status: Abnormal   Collection Time: 04/13/20 10:07 AM  Result Value Ref Range   Sodium 134 (L) 135 - 145 mmol/L   Potassium 4.3 3.5 - 5.1 mmol/L   Chloride 101 98 - 111 mmol/L   CO2 23 22 - 32 mmol/L   Glucose, Bld 105 (H) 70 - 99 mg/dL    Comment: Glucose reference range applies only to samples taken after  fasting for at least 8 hours.   BUN 15 8 - 23 mg/dL   Creatinine, Ser 4.43 0.61 - 1.24 mg/dL   Calcium 8.6 (L) 8.9 - 10.3 mg/dL   Total Protein 7.1 6.5 - 8.1 g/dL   Albumin 3.9 3.5 - 5.0 g/dL   AST 68 (H) 15 - 41 U/L   ALT 61 (H) 0 - 44 U/L   Alkaline Phosphatase 56 38 - 126 U/L   Total Bilirubin 1.0 0.3 -  1.2 mg/dL   GFR, Estimated >40>60 >98>60 mL/min    Comment: (NOTE) Calculated using the CKD-EPI Creatinine Equation (2021)    Anion gap 10 5 - 15    Comment: Performed at [facility], [address] [place], [place] [address]  Ethanol     Status: None   Collection Time: 04/13/20 10:07 AM  Result Value Ref Range   Alcohol, Ethyl (B) <10 <10 mg/dL    Comment: (NOTE) Lowest detectable limit for serum alcohol is 10 mg/dL.  For medical purposes only. Performed at [facility], [address] [place], [place] [address]     Current Facility-Administered Medications  Medication Dose Route Frequency Provider Last Rate Last Admin  . LORazepam (ATIVAN) injection 0-4 mg  0-4 mg Intravenous Q6H Jene EveryKinner, Robert, MD       Or  . LORazepam (ATIVAN) tablet 0-4 mg  0-4 mg Oral Q6H Jene EveryKinner, Robert, MD      . Melene Muller[START ON 04/15/2020] LORazepam (ATIVAN) injection 0-4 mg  0-4 mg Intravenous Conception OmsQ12H Kinner, Robert, MD       Or  . Melene Muller[START ON 04/15/2020] LORazepam (ATIVAN) tablet 0-4 mg  0-4 mg Oral Q12H Jene EveryKinner, Robert, MD      . thiamine tablet 100 mg  100 mg Oral Daily Jene EveryKinner, Robert, MD       Or  . thiamine (B-1) injection 100 mg  100 mg Intravenous Daily Jene EveryKinner, Robert, MD       Current Outpatient Medications  Medication Sig Dispense Refill  . ARIPiprazole (ABILIFY) 2 MG tablet Take 2 mg by mouth daily.    Marland Kitchen. atenolol (TENORMIN) 25 MG tablet TAKE 3 TABLETS (75 MG TOTAL)  DAILY. 270 tablet 1  . atorvastatin (LIPITOR) 20 MG tablet TAKE 1 TABLET EVERY DAY 90 tablet 1  . mirtazapine (REMERON) 15 MG tablet Take 15 mg by mouth daily.    . Multiple Vitamin (MULTIVITAMIN) tablet Take 1 tablet  by mouth daily.    . QUEtiapine (SEROQUEL) 25 MG tablet Take 75 mg by mouth at bedtime.       Musculoskeletal: Strength & Muscle Tone: decreased Gait & Station: normal Patient leans: N/A  Psychiatric Specialty Exam: Physical Exam Vitals and nursing note reviewed.  Constitutional:      Appearance: He is well-developed. He is ill-appearing.  HENT:     Head: Normocephalic and atraumatic.  Eyes:     Conjunctiva/sclera: Conjunctivae normal.     Pupils: Pupils are equal, round, and reactive to light.  Cardiovascular:     Heart sounds: Normal heart sounds.  Pulmonary:     Effort: Pulmonary effort is normal.  Abdominal:     Palpations: Abdomen is soft.  Musculoskeletal:        General: Normal range of motion.     Cervical back: Normal range of motion.  Skin:    General: Skin is warm and dry.       Neurological:     General: No focal deficit present.     Mental Status: He is alert.  Psychiatric:        Attention and Perception: He is inattentive.        Mood and Affect: Mood is anxious and depressed.        Speech: Speech is delayed.        Behavior: Behavior is withdrawn.        Thought Content: Thought content does not include homicidal or suicidal ideation.        Cognition and Memory:  Cognition is impaired.        Judgment: Judgment is inappropriate.     Review of Systems  Constitutional: Positive for fatigue and unexpected weight change.  HENT: Negative.   Eyes: Negative.   Respiratory: Negative.   Cardiovascular: Negative.   Gastrointestinal: Negative.   Musculoskeletal: Negative.   Skin: Negative.   Neurological: Positive for syncope.  Psychiatric/Behavioral: Positive for dysphoric mood. Negative for suicidal ideas. The patient is nervous/anxious.     Blood pressure 125/74, pulse 64, temperature 98 F (36.7 C), temperature source Oral, resp. rate 18, height 5\' 9"  (1.753 m), weight 72.6 kg, SpO2 99 %.Body mass index is 23.63 kg/m.  General Appearance:  Disheveled  Eye Contact:  Minimal  Speech:  Slow  Volume:  Decreased  Mood:  Depressed and Irritable  Affect:  Constricted and Depressed  Thought Process:  Coherent  Orientation:  Full (Time, Place, and Person)  Thought Content:  Illogical  Suicidal Thoughts:  No  Homicidal Thoughts:  No  Memory:  Immediate;   Fair Recent;   Fair Remote;   Fair  Judgement:  Impaired  Insight:  Shallow  Psychomotor Activity:  Decreased  Concentration:  Concentration: Poor  Recall:  Poor  Fund of Knowledge:  Fair  Language:  Fair  Akathisia:  No  Handed:  Right  AIMS (if indicated):     Assets:  Housing Social Support  ADL's:  Impaired  Cognition:  Impaired,  Mild  Sleep:        Treatment Plan Summary: Daily contact with patient to assess and evaluate symptoms and progress in treatment, Medication management and Plan 69 year old man who looks chronically ill.  Poor hygiene.  Extensive skin flaking, laceration on his face that was not cared for.  Family reports that he has had multiple falls and that his home has blood and feces on the floor.  Patient with poor insight poor motivation for care appears to be very depressed.  Will not acknowledge suicidal ideation but is clearly not taking care of himself safely.  Commitment papers already filed I will agree that he meets criteria and plan for admission to the inpatient psychiatric ward.  Case reviewed with emergency room physician.  We will make sure labs are completed as best as possible and continue current medicine pending further evaluation on the inpatient psychiatric ward.  Disposition: Recommend psychiatric Inpatient admission when medically cleared. Supportive therapy provided about ongoing stressors.  60, MD 04/13/2020 1:34 PM

## 2020-04-13 NOTE — ED Notes (Signed)
Resumed care from [employer].  Pt in hallway bed.  Pt is IVC.  Pt states he doesn't want his medicines now.  He reports that he takes his medicines at night.  Dr Toni Amend aware and will change med times.  Pt waiting on admission.  Family with pt.   Pt now lying on stretcher.

## 2020-04-13 NOTE — ED Notes (Addendum)
Pt. Belongings: Beige pants Brown shoes Black socks Black jacket [employer] Underwear [employer] Cell Phone Silver watch Black Wallet Keys Lighter Pt stated that he did have a pocket knife but this tech did not find a pocket knife with clothing and did not see any visual of pocket knife while dressing out pt.

## 2020-04-13 NOTE — ED Notes (Signed)

## 2020-04-13 NOTE — Assessment & Plan Note (Signed)
Secondary to alcoholism.  15 lb weight loss noted in the last 2 years.

## 2020-04-13 NOTE — BH Assessment (Signed)
Assessment Note  Shane Petersen is an 69 y.o. male who presents to the ER due to his son and his PCP having concerns about his wellbeing. Per the patient, he fell and hit his head. Patient further reports, his son and doctor are overacting about his drinking and doesn't believe it is a problem. Per the report of the patient's son ([name]), received a phone call from the patient's neighbors about him falling. The son lives in [place] and when he came to check on him, his hygiene had declined, and the house wasn't in poor living conditi0ons. He hadn't eaten and loss weight and his alcohol use had increase. When he returned (two weeks later) to take him to his doctor's appointment (today) he was wearing the same clothes and not taking his medications.  During the interview, the patient was upset and irritable. However, he was polite and participated. He was able to answer the questions without any problems. Whenever his son mentioned anything about him not taking care of his self and or his drinking, he rebuttal by saying "that's a lie." Throughout the interview, the patient denied SI/HI and AV/H.  Diagnosis: Major Depression  Past Medical History:  Past Medical History:  Diagnosis Date  . Depression   . Hyperlipidemia   . Hypertension   . Paroxysmal atrial fibrillation (HCC)   . Ulcerative colitis (HCC)     History reviewed. No pertinent surgical history.  Family History:  Family History  Problem Relation Age of Onset  . Cancer Neg Hx   . Heart disease Neg Hx     Social History:  reports that he has been smoking cigarettes. He has never used smokeless tobacco. He reports current alcohol use of about 21.0 standard drinks of alcohol per week. He reports that he does not use drugs.  Additional Social History:  Alcohol / Drug Use Pain Medications: See PTA Prescriptions: See PTA Over the Counter: See PTA History of alcohol / drug use?: Yes Longest period of sobriety  (when/how long): Unable to quantify Substance #1 Name of Substance 1: Alcohol  CIWA: CIWA-Ar BP: 139/78 Pulse Rate: 61 Nausea and Vomiting: no nausea and no vomiting Tactile Disturbances: none Tremor: no tremor Auditory Disturbances: not present Paroxysmal Sweats: no sweat visible Visual Disturbances: not present Anxiety: two Headache, Fullness in Head: none present Agitation: somewhat more than normal activity Orientation and Clouding of Sensorium: oriented and can do serial additions CIWA-Ar Total: 3 COWS:    Allergies: No Known Allergies  Home Medications: (Not in a hospital admission)   OB/GYN Status:  No LMP for male patient.  General Assessment Data Location of Assessment: [facility] ED TTS Assessment: In system Is this a Tele or Face-to-Face Assessment?: Face-to-Face Is this an Initial Assessment or a Re-assessment for this encounter?: Initial Assessment Patient Accompanied by:: N/A Language Other than English: No Living Arrangements: Other (Comment) What gender do you identify as?: Male Date Telepsych consult ordered in [facility]: 04/13/20 Time Telepsych consult ordered in [facility]: 1000 Marital status: Single Pregnancy Status: No Living Arrangements: Alone Can pt return to current living arrangement?: Yes Admission Status: Involuntary Petitioner: Family member Is patient capable of signing voluntary admission?: No (Under IVC) Referral Source: Self/Family/Friend Insurance type: [employer] MCR  Medical Screening Exam [facility] Walk-in ONLY) Medical Exam completed: Yes  Crisis Care Plan Living Arrangements: Alone Legal Guardian: Other: (self) Name of Psychiatrist: Reports of none Name of Therapist: Reports of none  Education Status Is patient currently in school?: No Is the patient  employed, unemployed or receiving disability?: Unemployed  Risk to self with the past 6 months Suicidal Ideation: No Has patient been a risk to self within the past 6 months prior to admission? :  No Suicidal Intent: No Has patient had any suicidal intent within the past 6 months prior to admission? : No Is patient at risk for suicide?: No Suicidal Plan?: No Has patient had any suicidal plan within the past 6 months prior to admission? : No Access to Means: No What has been your use of drugs/alcohol within the last 12 months?: Alcohsol Previous Attempts/Gestures: No How many times?: 0 Other Self Harm Risks: Reports of none Triggers for Past Attempts: None known Intentional Self Injurious Behavior: None Family Suicide History: No Recent stressful life event(s): Other (Comment) Persecutory voices/beliefs?: No Depression: Yes Depression Symptoms: Isolating Substance abuse history and/or treatment for substance abuse?: Yes Suicide prevention information given to non-admitted patients: Not applicable  Risk to Others within the past 6 months Homicidal Ideation: No Does patient have any lifetime risk of violence toward others beyond the six months prior to admission? : No Thoughts of Harm to Others: No Current Homicidal Intent: No Current Homicidal Plan: No Access to Homicidal Means: No Identified Victim: Reports of none History of harm to others?: No Assessment of Violence: None Noted Violent Behavior Description: Reports of none Does patient have access to weapons?: No Does patient have a court date: No Is patient on probation?: No  Psychosis Hallucinations: None noted Delusions: None noted  Mental Status Report Appearance/Hygiene: Unremarkable, In scrubs Eye Contact: Good Motor Activity: Freedom of movement, Unremarkable Speech: Logical/coherent, Unremarkable Level of Consciousness: Other (Comment), Alert Mood: Pleasant Affect: Appropriate to circumstance Anxiety Level: Minimal Thought Processes: Coherent, Relevant Judgement: Unimpaired Orientation: Person, Place, Time, Situation, Appropriate for developmental age Obsessive Compulsive Thoughts/Behaviors:  Minimal  Cognitive Functioning Concentration: Normal Memory: Recent Intact, Remote Intact Is patient IDD: No Insight: Fair Impulse Control: Fair Appetite: Good Have you had any weight changes? : No Change Sleep: No Change Total Hours of Sleep: 8 Vegetative Symptoms: None  ADLScreening [facility] Assessment Services) Patient's cognitive ability adequate to safely complete daily activities?: Yes Patient able to express need for assistance with ADLs?: Yes Independently performs ADLs?: Yes (appropriate for developmental age)  Prior Inpatient Therapy Prior Inpatient Therapy: No  Prior Outpatient Therapy Prior Outpatient Therapy: No Does patient have an ACCT team?: No Does patient have Intensive In-House Services?  : No Does patient have [facility] services? : No Does patient have P4CC services?: No  ADL Screening (condition at time of admission) Patient's cognitive ability adequate to safely complete daily activities?: Yes Is the patient deaf or have difficulty hearing?: No Does the patient have difficulty seeing, even when wearing glasses/contacts?: No Does the patient have difficulty concentrating, remembering, or making decisions?: No Patient able to express need for assistance with ADLs?: Yes Does the patient have difficulty dressing or bathing?: No Independently performs ADLs?: Yes (appropriate for developmental age) Does the patient have difficulty walking or climbing stairs?: No Weakness of Legs: None Weakness of Arms/Hands: None  Home Assistive Devices/Equipment Home Assistive Devices/Equipment: None  Therapy Consults (therapy consults require a physician order) PT Evaluation Needed: No OT Evalulation Needed: No SLP Evaluation Needed: No Abuse/Neglect Assessment (Assessment to be complete while patient is alone) Abuse/Neglect Assessment Can Be Completed: Yes Physical Abuse: Denies Verbal Abuse: Denies Sexual Abuse: Denies Exploitation of patient/patient's resources:  Denies Self-Neglect: Denies Values / Beliefs Cultural Requests During Hospitalization: None Spiritual Requests During Hospitalization:  None Consults Spiritual Care Consult Needed: No Transition of Care Team Consult Needed: No Advance Directives (For Healthcare) Does Patient Have a Medical Advance Directive?: No Would patient like information on creating a medical advance directive?: No - Patient declined  Disposition:  Disposition Initial Assessment Completed for this Encounter: Yes  On Site Evaluation by:   Reviewed with Physician:    Lilyan Gilford MS, LCAS, [facility], N[facility] Therapeutic Triage Specialist 04/13/2020 4:58 PM

## 2020-04-13 NOTE — Assessment & Plan Note (Signed)
Secondary to dehydration from alcohol abuse

## 2020-04-13 NOTE — ED Notes (Signed)
Psychiatrist at bedside

## 2020-04-13 NOTE — ED Notes (Signed)
Report off to jeanette rn.  Pt dressed out and taken by wheelchair to [facility].  Pt calm and cooperative. Pt's son Annette Stable aware pt moved to [facility].

## 2020-04-13 NOTE — Assessment & Plan Note (Signed)
Patient is currently incompetent to comply with my advice and that of his psychiatrist. His son Annette Stable has POA.  Involuntary commitment for detoxification is needed to prevent permanent harm as patient has already sustained a laceration to his scalp days ago that should have received stitches

## 2020-04-13 NOTE — ED Notes (Signed)
Pt in restroom 

## 2020-04-13 NOTE — Assessment & Plan Note (Signed)
Head CT needed to rule out SDH.  Will arrange transport to ER for evaluation

## 2020-04-13 NOTE — ED Notes (Signed)
Per Son, pt consumes aprox 20-beers a-day , has been declining cognitively ( Pt AOX4) ,

## 2020-04-13 NOTE — ED Notes (Signed)
Patient moved over to BHU 3, off unit for CT head s/p fall.

## 2020-04-13 NOTE — ED Provider Notes (Addendum)
[facility] Emergency Department Provider Note   ____________________________________________    I have reviewed the triage vital signs and the nursing notes.   HISTORY  Chief Complaint Fall     HPI Shane Petersen is a 69 y.o. male sent from his PCPs office with his son who is medical POA who presents after a fall, with reported alcohol abuse.  Son reports that patient is having multiple falls, is living in squalor, house is covered in feces and trash reportedly.  Patient states that he does not think that he has a problem, he admits to drinking 5 beers a day, however his son reports it is more like 15.  No reports of HI or SI  Past Medical History:  Diagnosis Date   Depression    Hyperlipidemia    Hypertension    Paroxysmal atrial fibrillation (HCC)    Ulcerative colitis [facility])     Patient Active Problem List   Diagnosis Date Noted   Scalp laceration, initial encounter 04/13/2020   Alcohol withdrawal delirium, acute, hyperactive (HCC) 04/13/2020   Orthostatic hypotension 04/13/2020   Abnormal intentional weight loss 04/13/2020   Dizziness 08/12/2019   Elevated blood pressure reading with diagnosis of hypertension 10/17/2017   Leukocytosis 10/02/2015   Encounter for preventive health examination 09/20/2013   Hyperlipidemia LDL goal <130 12/02/2011   Tobacco abuse, episodic 12/02/2011   Special screening for malignant neoplasm of prostate 12/02/2011   Paroxysmal atrial fibrillation (HCC)    Ulcerative colitis (HCC)    Insomnia 11/29/2011   Major depressive disorder, recurrent episode, moderate (HCC) 11/29/2011    History reviewed. No pertinent surgical history.  Prior to Admission medications   Medication Sig Start Date End Date Taking? Authorizing Provider  ARIPiprazole (ABILIFY) 2 MG tablet Take 2 mg by mouth daily.    [provider]  atenolol (TENORMIN) 25 MG tablet TAKE 3 TABLETS (75 MG TOTAL)  DAILY.  01/14/20   Sherlene Shams, MD  atorvastatin (LIPITOR) 20 MG tablet TAKE 1 TABLET EVERY DAY 01/14/20   Sherlene Shams, MD  mirtazapine (REMERON) 15 MG tablet Take 15 mg by mouth daily. 08/20/13   [provider]  Multiple Vitamin (MULTIVITAMIN) tablet Take 1 tablet by mouth daily.    [provider]  QUEtiapine (SEROQUEL) 25 MG tablet Take 75 mg by mouth at bedtime.  01/20/20   [provider]     Allergies Patient has no known allergies.  Family History  Problem Relation Age of Onset   Cancer Neg Hx    Heart disease Neg Hx     Social History Social History   Tobacco Use   Smoking status: Current Every Day Smoker    Types: Cigarettes   Smokeless tobacco: Never Used  Substance Use Topics   Alcohol use: Yes    Alcohol/week: 21.0 standard drinks    Types: 21 Standard drinks or equivalent per week   Drug use: No    Review of Systems  Constitutional: No fever/chills Eyes: No visual changes.  ENT: No sore throat. Cardiovascular: Denies chest pain. Respiratory: Denies shortness of breath. Gastrointestinal: No abdominal pain.   Genitourinary: Negative for dysuria. Musculoskeletal: Mild knee pain Skin: Healed laceration to the forehead, skinned knees Neurological: Negative for headaches or weakness   ____________________________________________   PHYSICAL EXAM:  VITAL SIGNS: ED Triage Vitals  Enc Vitals Group     BP 04/13/20 0948 126/70     Pulse Rate 04/13/20 0948 68  Resp 04/13/20 0948 16     Temp 04/13/20 0948 99.1 F (37.3 C)     Temp Source 04/13/20 0948 Oral     SpO2 04/13/20 0948 98 %     Weight 04/13/20 0946 72.6 kg (160 lb)     Height 04/13/20 0946 1.753 m (5\' 9" )     Head Circumference --      Peak Flow --      Pain Score 04/13/20 0946 0     Pain Loc --      Pain Edu? --      Excl. in GC? --     Constitutional: Alert and oriented.   Head: 3 cm healing laceration above the right eye Nose: No  congestion/rhinnorhea. Mouth/Throat: Mucous membranes are moist.   Neck:  Painless ROM Cardiovascular: Normal rate, regular rhythm. Good peripheral circulation. Respiratory: Normal respiratory effort.  No retractions.    Musculoskeletal: Full range of motion of all extremities warm and well perfused Neurologic:  Normal speech and language. No gross focal neurologic deficits are appreciated.  Skin:  Skin is warm, dry Psychiatric: Mood and affect are normal. Speech and behavior are normal.  ____________________________________________   LABS (all labs ordered are listed, but only abnormal results are displayed)  Labs Reviewed  COMPREHENSIVE METABOLIC PANEL - Abnormal; Notable for the following components:      Result Value   Sodium 134 (*)    Glucose, Bld 105 (*)    Calcium 8.6 (*)    AST 68 (*)    ALT 61 (*)    All other components within normal limits  RESPIRATORY PANEL BY RT PCR (FLU A&B, COVID)  CBC  ETHANOL  URINE DRUG SCREEN, QUALITATIVE ([facility] ONLY)   ____________________________________________  EKG   ____________________________________________  RADIOLOGY  None ____________________________________________   PROCEDURES  Procedure(s) performed: No  Procedures   Critical Care performed: No ____________________________________________   INITIAL IMPRESSION / ASSESSMENT AND PLAN / ED COURSE  Pertinent labs & imaging results that were available during my care of the patient were reviewed by me and considered in my medical decision making (see chart for details).  Patient presents for evaluation of alcohol dependence, unsafe living conditions.  Son is here who is medical power of attorney would like patient involuntarily committed.  Have consulted psychiatry, patient seen by Dr. 13/03/21 who has completed the paperwork and will work on admission for the patient.  Lab work today is unremarkable, the patient is medically cleared.  Laceration is old, no  indication for repair at this time.  The patient has been placed in psychiatric observation due to the need to provide a safe environment for the patient while obtaining psychiatric consultation and evaluation, as well as ongoing medical and medication management to treat the patient's condition.  The patient has been placed under full IVC at this time.     ____________________________________________   FINAL CLINICAL IMPRESSION(S) / ED DIAGNOSES  Final diagnoses:  Fall, initial encounter  Alcohol abuse        Note:  This document was prepared using Dragon voice recognition software and may include unintentional dictation errors.   07-07-1972, MD 04/13/20 1331    13/03/21, MD 04/13/20 1453

## 2020-04-14 ENCOUNTER — Other Ambulatory Visit: Payer: Self-pay

## 2020-04-14 ENCOUNTER — Inpatient Hospital Stay
Admission: RE | Admit: 2020-04-14 | Discharge: 2020-04-19 | DRG: 885 | Disposition: A | Discharge status: Home / Self Care | Payer: Medicare HMO | Source: Intra-hospital | Attending: Behavioral Health | Admitting: Behavioral Health

## 2020-04-14 ENCOUNTER — Encounter: Payer: Self-pay | Admitting: Psychiatry

## 2020-04-14 DIAGNOSIS — I951 Orthostatic hypotension: Secondary | ICD-10-CM | POA: Diagnosis present

## 2020-04-14 DIAGNOSIS — F10231 Alcohol dependence with withdrawal delirium: Secondary | ICD-10-CM | POA: Diagnosis present

## 2020-04-14 DIAGNOSIS — I10 Essential (primary) hypertension: Secondary | ICD-10-CM | POA: Diagnosis present

## 2020-04-14 DIAGNOSIS — E785 Hyperlipidemia, unspecified: Secondary | ICD-10-CM | POA: Diagnosis present

## 2020-04-14 DIAGNOSIS — I480 Paroxysmal atrial fibrillation: Secondary | ICD-10-CM | POA: Diagnosis not present

## 2020-04-14 DIAGNOSIS — Z720 Tobacco use: Secondary | ICD-10-CM | POA: Diagnosis present

## 2020-04-14 DIAGNOSIS — F332 Major depressive disorder, recurrent severe without psychotic features: Principal | ICD-10-CM | POA: Diagnosis present

## 2020-04-14 DIAGNOSIS — F1010 Alcohol abuse, uncomplicated: Secondary | ICD-10-CM | POA: Diagnosis not present

## 2020-04-14 DIAGNOSIS — F17210 Nicotine dependence, cigarettes, uncomplicated: Secondary | ICD-10-CM | POA: Diagnosis not present

## 2020-04-14 DIAGNOSIS — F10931 Alcohol use, unspecified with withdrawal delirium: Secondary | ICD-10-CM | POA: Diagnosis present

## 2020-04-14 DIAGNOSIS — G4700 Insomnia, unspecified: Secondary | ICD-10-CM | POA: Diagnosis not present

## 2020-04-14 DIAGNOSIS — R7402 Elevation of levels of lactic acid dehydrogenase (LDH): Secondary | ICD-10-CM | POA: Diagnosis present

## 2020-04-14 MED ORDER — LORAZEPAM 2 MG/ML IJ SOLN: 0.0000 mg | Freq: Two times a day (BID) | INTRAMUSCULAR | Status: AC

## 2020-04-14 MED ORDER — ACETAMINOPHEN 325 MG PO TABS: 650.0000 mg | ORAL_TABLET | Freq: Four times a day (QID) | ORAL | Status: DC | PRN

## 2020-04-14 MED ORDER — LORAZEPAM 2 MG PO TABS: 0.0000 mg | ORAL_TABLET | Freq: Four times a day (QID) | ORAL | Status: AC

## 2020-04-14 MED ORDER — MAGNESIUM HYDROXIDE 400 MG/5ML PO SUSP: 30.0000 mL | Freq: Every day | ORAL | Status: DC | PRN

## 2020-04-14 MED ORDER — QUETIAPINE FUMARATE 25 MG PO TABS
75.0000 mg | ORAL_TABLET | Freq: Every day | ORAL | Status: DC
  Administered 2020-04-14: 75 mg via ORAL
  Filled 2020-04-14: qty 3

## 2020-04-14 MED ORDER — MIRTAZAPINE 15 MG PO TABS
15.0000 mg | ORAL_TABLET | Freq: Every day | ORAL | Status: DC
  Administered 2020-04-14 – 2020-04-18 (×4): 15 mg via ORAL
  Filled 2020-04-14 (×5): qty 1

## 2020-04-14 MED ORDER — ALUM & MAG HYDROXIDE-SIMETH 200-200-20 MG/5ML PO SUSP: 30.0000 mL | ORAL | Status: DC | PRN

## 2020-04-14 MED ORDER — LORAZEPAM 2 MG PO TABS
0.0000 mg | ORAL_TABLET | Freq: Two times a day (BID) | ORAL | Status: AC
  Administered 2020-04-15 – 2020-04-16 (×2): 2 mg via ORAL
  Filled 2020-04-14 (×3): qty 1

## 2020-04-14 MED ORDER — THIAMINE HCL 100 MG/ML IJ SOLN: 100.0000 mg | Freq: Every day | INTRAMUSCULAR | Status: DC

## 2020-04-14 MED ORDER — THIAMINE HCL 100 MG PO TABS
100.0000 mg | ORAL_TABLET | Freq: Every day | ORAL | Status: DC
  Administered 2020-04-15 – 2020-04-19 (×5): 100 mg via ORAL
  Filled 2020-04-14 (×6): qty 1

## 2020-04-14 MED ORDER — LORAZEPAM 2 MG/ML IJ SOLN: 0.0000 mg | Freq: Four times a day (QID) | INTRAMUSCULAR | Status: AC

## 2020-04-14 MED ORDER — ATENOLOL 50 MG PO TABS
75.0000 mg | ORAL_TABLET | Freq: Every day | ORAL | Status: DC
  Administered 2020-04-14 – 2020-04-15 (×2): 75 mg via ORAL
  Filled 2020-04-14 (×2): qty 1

## 2020-04-14 NOTE — Progress Notes (Signed)
Pt refused blood pressure medication despite being informed of this HTN. Will continue to monitor pt.

## 2020-04-14 NOTE — ED Provider Notes (Signed)
Emergency Medicine Observation Re-evaluation Note  Shane Petersen is a 69 y.o. male, seen on rounds today.  Pt initially presented to the ED for complaints of Fall Currently, the patient is resting, in NAD.  Physical Exam  BP 122/65 (BP Location: Left Arm)   Pulse 60   Temp 99.3 F (37.4 C) (Oral)   Resp 18   Ht 5\' 9"  (1.753 m)   Wt 72.6 kg   SpO2 95%   BMI 23.63 kg/m  Physical Exam General: NAD Cardiac: well perfused Lungs: even and unlabored Psych: calm  ED Course / MDM  EKG:    I have reviewed the labs performed to date as well as medications administered while in observation.  Recent changes in the last 24 hours include none.  Plan  Current plan is for psych dispo. Patient is under full IVC at this time.   , MD 04/14/20 [phone]

## 2020-04-14 NOTE — ED Notes (Signed)
Pt's son called to check on patient. Pt given phone.

## 2020-04-14 NOTE — ED Notes (Signed)
Pt stated he did not want a nicotine patch, he wanted a cigarette. Refused patch.

## 2020-04-14 NOTE — Progress Notes (Signed)
Admission Note:   Pt is admitted to [facility] BMU unit from psych ED. Per report pt's son brought pt in due to increase in alcohol use, and having several recent falls at home (multiple abrasions, scabs, bruises from his face, arms, knees), see Skin assessment for details, no signs of infection. Pt reports he drinks 5 beers per day, pt's son told ED nurse that he has been drinking more, reports 12-15 beers per day. Pt reports a history of depression "for a long time now" and sees an outpt psychiatrist. Per ED nurse report pt CIWA score has been no more than 2-3, pt denies alcohol abuse, on unit after admit is CIWA=6, pt offered Ativan, pt firmly refused and reported "I am not withdrawing from alcohol, see my hands are not trembling, my CIWA is 6 because I'm angry not withdrawing." This Clinical research associate respectfully disagreed and informed pt that he is having some alcohol withdrawal symptoms, but pt continued to deny that and dismissed this Clinical research associate. The above information was passed on in shift report to oncoming shift. Pt ate dinner, and at times appears confused, after being offered dinner an hour earlier and pt looking at it then refusing dinner, pt came back later stating, "When is dinner around here?" Pt was asked if he remembered that we offered it to him earlier and pt did not recall. Pt also is easily irritable. Pt reports he does not believe he should have been admitted and asked repeatedly, "Why am I here... Can you tell me why I'm here." Pt was informed of reason for admission, and pt got up and walked off. Pt was demanding discharge and was encouraged to speak with the doctor tomorrow regarding discharge possibilities, but that at this time he was not being discharged. Pt later noted to be resting quietly in his room reading the bible. Pt denies medical and surgical history other than HTN. Pt reports he smokes 1.5 packs per day cigarettes. Pt denies suicidal and homicidal ideation, denies hallucinations, denies feelings  of depression and anxiety. Will continue to monitor pt per Q15 minute face checks and monitor for safety and progress.

## 2020-04-14 NOTE — BH Assessment (Signed)
Referral information for Psychiatric Hospitalization faxed to;   [facility] [phone] -or- [phone])  . [facility] [phone]),   . [facility] [phone] -or- [phone]),   . [facility] [phone] or [phone])  . [facility] [phone])

## 2020-04-14 NOTE — [facility] Group Notes (Signed)
[facility] Group Notes:  (Nursing/MHT/Case Management/Adjunct)  Date:  04/14/2020  Time:  10:44 PM  Type of Therapy:  Group Therapy  Participation Level:  Active  Participation Quality:  Appropriate and left out of group early.  Mayra Neer 04/14/2020, 10:44 PM

## 2020-04-14 NOTE — ED Notes (Signed)
Pt discharged under IVC to BMU. VS stable. Belongings sent with patient. Pt unhappy with admission, but accepting at this time.

## 2020-04-14 NOTE — ED Notes (Signed)
Pt to nurse's station door stating there is nothing wrong with him and he wants to go home.  RN informed pt he was being referred for inpatient treatment.  Pt wanting to speak with the psychiatrist, but accepting of disposition.

## 2020-04-15 DIAGNOSIS — F332 Major depressive disorder, recurrent severe without psychotic features: Principal | ICD-10-CM

## 2020-04-15 MED ORDER — TRAZODONE HCL 50 MG PO TABS
50.0000 mg | ORAL_TABLET | Freq: Every evening | ORAL | Status: DC | PRN
  Administered 2020-04-16 – 2020-04-17 (×2): 50 mg via ORAL
  Filled 2020-04-15 (×2): qty 1

## 2020-04-15 MED ORDER — TRAZODONE HCL 50 MG PO TABS
50.0000 mg | ORAL_TABLET | Freq: Every day | ORAL | Status: DC
  Administered 2020-04-16 – 2020-04-17 (×2): 50 mg via ORAL
  Filled 2020-04-15 (×2): qty 1

## 2020-04-15 MED ORDER — ATENOLOL 50 MG PO TABS
75.0000 mg | ORAL_TABLET | Freq: Every evening | ORAL | Status: DC
  Administered 2020-04-16 – 2020-04-18 (×3): 75 mg via ORAL
  Filled 2020-04-15 (×4): qty 1

## 2020-04-15 NOTE — Plan of Care (Signed)
  Problem: Education: Goal: Knowledge of [facility] Education information/materials will improve Outcome: Progressing Goal: Emotional status will improve Outcome: Progressing Goal: Mental status will improve Outcome: Progressing Goal: Verbalization of understanding the information provided will improve Outcome: Progressing   Problem: Activity: Goal: Interest or engagement in activities will improve Outcome: Progressing Goal: Sleeping patterns will improve Outcome: Progressing   Problem: Coping: Goal: Ability to verbalize frustrations and anger appropriately will improve Outcome: Progressing Goal: Ability to demonstrate self-control will improve Outcome: Progressing   Problem: Health Behavior/Discharge Planning: Goal: Identification of resources available to assist in meeting health care needs will improve Outcome: Progressing Goal: Compliance with treatment plan for underlying cause of condition will improve Outcome: Progressing   Problem: Physical Regulation: Goal: Ability to maintain clinical measurements within normal limits will improve Outcome: Progressing   Problem: Safety: Goal: Periods of time without injury will increase Outcome: Progressing   Problem: Education: Goal: Utilization of techniques to improve thought processes will improve Outcome: Progressing Goal: Knowledge of the prescribed therapeutic regimen will improve Outcome: Progressing   Problem: Activity: Goal: Interest or engagement in leisure activities will improve Outcome: Progressing Goal: Imbalance in normal sleep/wake cycle will improve Outcome: Progressing   Problem: Coping: Goal: Coping ability will improve Outcome: Progressing Goal: Will verbalize feelings Outcome: Progressing   Problem: Health Behavior/Discharge Planning: Goal: Ability to make decisions will improve Outcome: Progressing Goal: Compliance with therapeutic regimen will improve Outcome: Progressing    Problem: Role Relationship: Goal: Will demonstrate positive changes in social behaviors and relationships Outcome: Progressing   Problem: Safety: Goal: Ability to disclose and discuss suicidal ideas will improve Outcome: Progressing Goal: Ability to identify and utilize support systems that promote safety will improve Outcome: Progressing   Problem: Self-Concept: Goal: Will verbalize positive feelings about self Outcome: Progressing Goal: Level of anxiety will decrease Outcome: Progressing   Problem: Education: Goal: Knowledge of disease or condition will improve Outcome: Progressing Goal: Understanding of discharge needs will improve Outcome: Progressing   Problem: Health Behavior/Discharge Planning: Goal: Ability to identify changes in lifestyle to reduce recurrence of condition will improve Outcome: Progressing Goal: Identification of resources available to assist in meeting health care needs will improve Outcome: Progressing   Problem: Physical Regulation: Goal: Complications related to the disease process, condition or treatment will be avoided or minimized Outcome: Progressing   Problem: Safety: Goal: Ability to remain free from injury will improve Outcome: Progressing   

## 2020-04-15 NOTE — Progress Notes (Signed)
Patient refuses to take any Ativan per CIWA protocol he states he does not have anxiety. He also states he is not in withdrawal and refuses to take any medication for withdrawal symptoms. He is complaint with monitoring of vitals although. He denies any SI, HI or anxiety. He seemed to rest well through out the night with no issues to report on shift at this time.

## 2020-04-15 NOTE — H&P (Addendum)
Psychiatric Admission Assessment Adult  Patient Identification: Shane Petersen MRN:  [id number] Date of Evaluation:  04/15/2020 Chief Complaint:  Severe recurrent major depression without psychotic features (HCC) [F33.2] Principal Diagnosis: Severe recurrent major depression without psychotic features (HCC) Diagnosis:  Principal Problem:   Severe recurrent major depression without psychotic features (HCC) Active Problems:   Insomnia   Paroxysmal atrial fibrillation (HCC)   Tobacco abuse, episodic   Alcohol withdrawal delirium, acute, hyperactive (HCC)   Alcohol abuse  History of Present Illness:  Patient seen and assessed during treatment team and again one-on-one today. He continues to request to go home. He denies suicidal ideations, homicidal ideations, visual hallucinations, and auditory hallucinations. He continues to insist he only drinks 5 beers a day which is only enough to relax him, and he denies getting drunk. He currently denies withdrawal symptoms, and denies history of DTs or seizures with alcohol withdrawals. When asked about his falls at home, he attributes this to Seroquel. He notes that he has been treated for depression by Dr. Raj Janus. He has been on mirtazpine for several years, and has found this beneficial. He was briefly on Abilify, but this was discontinued due to cost. He has not noticed worsening depression or anxiety since stopping this medication. He states he was on seroquel for insomnia. He has previously tried [place], but not other sleep aids. He is open to discontinuing Seroquel and starting Trazodone as alternate treatment for insomnia with less risk of orthostatic hypotension. He also agreed to complete [facility] with me and scored a 24/30. He also gives permission for me to speak to his son and physicians.       Spoke with son, Annette Stable, [phone]. Updated son on MOCA score, current status in the hospital, and medication changes. He notes that his father has  minimized his alcohol use for several years. However, he begins drinking at 10AM, and gets intoxicated to the point where he speaks "gibberish", falls over, becomes fecally incontinent, and yells at others. He previously had a friend that would come over and take him out to dinner every Thursday night, but they had to stop doing so because he would be intoxicated to point of falling by 5Pm arrival. He would also yell at [place] staff at the restaurants. Son reports that last year at Thanksgiving he was intoxicated to point he nearly fell on [place], and past Christmas fell over at sons house. They have been attempting to coordinate with Dr. Raj Janus for substance abuse treatment for some years.   Message sent to Dr. Darrick Huntsman to update on hospital course, medication changes, and plan of care as well. She notes above concerns for minimization of alcohol use disorder.   Associated Signs/Symptoms: Depression Symptoms:  depressed mood, insomnia, fatigue, impaired memory, weight loss, decreased appetite, Duration of Depression Symptoms: No data recorded (Hypo) Manic Symptoms:  Impulsivity, Anxiety Symptoms:  Denies Psychotic Symptoms:  Denies Duration of Psychotic Symptoms: No data recorded PTSD Symptoms: Negative Total Time spent with patient: 45 minutes  Past Psychiatric History: Has been treated for depression for several years from his psychiatrist in [place]. Reports benefit in mood from Mirtazapine. Has tried Abilify 2 mg in the past, but discontinued due to cost and lack of efficacy. Seroquel 75 mg was given for insomnia, but patient reports orthostatic hypotension. Only other prior sleep aid was Lunesta. Denies any history of suicide attempts.  Denies inpatient treatment.  Denies ever being involved in any substance abuse treatment.  Denies any history of seizures or  delirium tremens  Is the patient at risk to self? Yes.    Has the patient been a risk to self in the past 6 months? Yes.    Has the  patient been a risk to self within the distant past? Yes.    Is the patient a risk to others? No.  Has the patient been a risk to others in the past 6 months? No.  Has the patient been a risk to others within the distant past? No.   Prior Inpatient Therapy:   Prior Outpatient Therapy:    Alcohol Screening: 1. How often do you have a drink containing alcohol?: 4 or more times a week 2. How many drinks containing alcohol do you have on a typical day when you are drinking?: 5 or 6 3. How often do you have six or more drinks on one occasion?: Never AUDIT-C Score: 6 4. How often during the last year have you found that you were not able to stop drinking once you had started?: Never 5. How often during the last year have you failed to do what was normally expected from you because of drinking?: Never 6. How often during the last year have you needed a first drink in the morning to get yourself going after a heavy drinking session?: Never 7. How often during the last year have you had a feeling of guilt of remorse after drinking?: Never 8. How often during the last year have you been unable to remember what happened the night before because you had been drinking?: Never 9. Have you or someone else been injured as a result of your drinking?: No 10. Has a relative or friend or a doctor or another health worker been concerned about your drinking or suggested you cut down?: Yes, during the last year Alcohol Use Disorder Identification Test Final Score (AUDIT): 10 Alcohol Brief Interventions/Follow-up:  (dees not believe he is having withdrawls or a etoh problem) Substance Abuse History in the last 12 months:  Yes.   Consequences of Substance Abuse: Medical Consequences:  three falls leading to laceration above right eyebrow Family Consequences:  unable to attend holiday events due to alcohol use and acute intoxication around grandchildren Blackouts:  Per family, has been unable to recall some episodes  of drunken behavior Withdrawal Symptoms:   Headaches Tremors Previous Psychotropic Medications: Yes  Psychological Evaluations: Yes  Past Medical History:  Past Medical History:  Diagnosis Date  . Depression   . Hyperlipidemia   . Hypertension   . Paroxysmal atrial fibrillation (HCC)   . Ulcerative colitis (HCC)    History reviewed. No pertinent surgical history. Family History:  Family History  Problem Relation Age of Onset  . Cancer Neg Hx   . Heart disease Neg Hx    Family Psychiatric  History: None reported Tobacco Screening:   Social History:  Social History   Substance and Sexual Activity  Alcohol Use Yes  . Alcohol/week: 21.0 standard drinks  . Types: 21 Standard drinks or equivalent per week     Social History   Substance and Sexual Activity  Drug Use No    Additional Social History: Marital status: Divorced Divorced, when?: "long time ago, 15 years ago" Does patient have children?: Yes How many children?: 4 How is patient's relationship with their children?: "fine, they all live in other places, one in [place] and two in [place]"  Allergies:  No Known Allergies Lab Results: No results found for this or any previous visit (from the past 48 hour(s)).  Blood Alcohol level:  Lab Results  Component Value Date   ETH <10 04/13/2020    Metabolic Disorder Labs:  Lab Results  Component Value Date   HGBA1C 5.6 04/13/2020   MPG 114.02 04/13/2020   No results found for: PROLACTIN Lab Results  Component Value Date   CHOL 137 04/13/2020   TRIG 93 04/13/2020   HDL 45 04/13/2020   CHOLHDL 3.0 04/13/2020   VLDL 19 04/13/2020   LDLCALC 73 04/13/2020   LDLCALC 60 11/27/2011    Current Medications: Current Facility-Administered Medications  Medication Dose Route Frequency Provider Last Rate Last Admin  . acetaminophen (TYLENOL) tablet 650 mg  650 mg Oral Q6H PRN Clapacs, Rachael T, MD      . alum & mag  hydroxide-simeth (MAALOX/MYLANTA) 200-200-20 MG/5ML suspension 30 mL  30 mL Oral Q4H PRN Clapacs, Jackquline Denmark, MD      . Melene Muller ON 04/16/2020] atenolol (TENORMIN) tablet 75 mg  75 mg Oral QPM Jesse Sans, MD      . LORazepam (ATIVAN) injection 0-4 mg  0-4 mg Intravenous Q6H Clapacs, Jackquline Denmark, MD       Or  . LORazepam (ATIVAN) tablet 0-4 mg  0-4 mg Oral Q6H Clapacs, Jasmond T, MD      . LORazepam (ATIVAN) injection 0-4 mg  0-4 mg Intravenous Q12H Clapacs, Jackquline Denmark, MD       Or  . LORazepam (ATIVAN) tablet 0-4 mg  0-4 mg Oral Q12H Clapacs, Tymon T, MD      . magnesium hydroxide (MILK OF MAGNESIA) suspension 30 mL  30 mL Oral Daily PRN Clapacs, Yordan T, MD      . mirtazapine (REMERON) tablet 15 mg  15 mg Oral QHS Clapacs, Jackquline Denmark, MD   15 mg at 04/14/20 2110  . thiamine tablet 100 mg  100 mg Oral Daily Clapacs, Tillmon T, MD   100 mg at 04/15/20 0754   Or  . thiamine (B-1) injection 100 mg  100 mg Intravenous Daily Clapacs, Carmel T, MD      . traZODone (DESYREL) tablet 50 mg  50 mg Oral QHS Jesse Sans, MD      . traZODone (DESYREL) tablet 50 mg  50 mg Oral QHS PRN Jesse Sans, MD       PTA Medications: Medications Prior to Admission  Medication Sig Dispense Refill Last Dose  . ARIPiprazole (ABILIFY) 2 MG tablet Take 2 mg by mouth daily. (Patient not taking: Reported on 04/13/2020)     . aspirin EC 81 MG tablet Take 81 mg by mouth daily. Swallow whole.     Marland Kitchen atenolol (TENORMIN) 25 MG tablet TAKE 3 TABLETS (75 MG TOTAL)  DAILY. (Patient taking differently: Take 75 mg by mouth daily. ) 270 tablet 1   . atorvastatin (LIPITOR) 20 MG tablet TAKE 1 TABLET EVERY DAY (Patient taking differently: Take 20 mg by mouth daily. ) 90 tablet 1   . gabapentin (NEURONTIN) 100 MG capsule Take 300 mg by mouth at bedtime.     . mirtazapine (REMERON) 15 MG tablet Take 15 mg by mouth daily.     . Multiple Vitamin (MULTIVITAMIN) tablet Take 1 tablet by mouth daily.     . QUEtiapine (SEROQUEL) 25 MG tablet Take 75 mg by  mouth at bedtime.        Musculoskeletal: Strength & Muscle  Tone: within normal limits Gait & Station: normal Patient leans: N/A  Psychiatric Specialty Exam: Physical Exam Vitals and nursing note reviewed.  Constitutional:      Appearance: Normal appearance.  HENT:     Head: Normocephalic.      Right Ear: External ear normal.     Left Ear: External ear normal.     Nose: Nose normal.     Mouth/Throat:     Mouth: Mucous membranes are moist.     Pharynx: Oropharynx is clear.  Eyes:     Extraocular Movements: Extraocular movements intact.     Conjunctiva/sclera: Conjunctivae normal.     Pupils: Pupils are equal, round, and reactive to light.  Cardiovascular:     Rate and Rhythm: Normal rate.     Pulses: Normal pulses.  Pulmonary:     Effort: Pulmonary effort is normal.     Breath sounds: Normal breath sounds.  Abdominal:     General: Abdomen is flat.     Palpations: Abdomen is soft.  Musculoskeletal:        General: No swelling. Normal range of motion.     Cervical back: Normal range of motion and neck supple.  Skin:    General: Skin is warm.     Findings: Lesion present.  Neurological:     General: No focal deficit present.     Mental Status: He is alert and oriented to person, place, and time.  Psychiatric:        Attention and Perception: Attention and perception normal.        Mood and Affect: Mood is depressed. Affect is angry.        Speech: Speech normal.        Behavior: Behavior is agitated.        Thought Content: Thought content does not include homicidal or suicidal ideation.        Cognition and Memory: Cognition and memory normal.        Judgment: Judgment is impulsive.     Review of Systems  Constitutional: Positive for appetite change and unexpected weight change.  HENT: Negative for rhinorrhea and sore throat.   Eyes: Negative for photophobia and visual disturbance.  Respiratory: Negative for cough and shortness of breath.   Cardiovascular:  Negative for chest pain and palpitations.  Gastrointestinal: Negative for constipation, diarrhea, nausea and vomiting.  Endocrine: Negative for cold intolerance and heat intolerance.  Genitourinary: Negative for difficulty urinating and dysuria.  Musculoskeletal: Negative for arthralgias and myalgias.  Skin: Negative for rash and wound.  Allergic/Immunologic: Negative for environmental allergies and food allergies.  Neurological: Positive for dizziness, syncope and light-headedness.  Hematological: Negative for adenopathy. Does not bruise/bleed easily.  Psychiatric/Behavioral: Positive for behavioral problems, confusion and sleep disturbance.    Blood pressure (!) 141/84, pulse (!) 58, temperature 98.4 F (36.9 C), temperature source Oral, resp. rate 17, height 5' 8.9" (1.75 m), weight 67.6 kg, SpO2 100 %.Body mass index is 22.07 kg/m.  General Appearance: Disheveled  Eye Contact:  Fair  Speech:  Clear and Coherent  Volume:  Normal  Mood:  Depressed  Affect:  Congruent  Thought Process:  Coherent and Linear  Orientation:  Full (Time, Place, and Person)  Thought Content:  Logical  Suicidal Thoughts:  No  Homicidal Thoughts:  No  Memory:  Immediate;   Fair Recent;   Fair Remote;   Fair  Judgement:  Impaired  Insight:  Lacking  Psychomotor Activity:  Normal  Concentration:  Concentration: Good and  Attention Span: Good  Recall:  [employer] of Knowledge:  Fair  Language:  Good  Akathisia:  Negative  Handed:  Right  AIMS (if indicated):     Assets:  Communication Skills Desire for Improvement Financial Resources/Insurance Housing Social Support Talents/Skills  ADL's:  Intact  Cognition:  WNL  Sleep:  Number of Hours: 6       Treatment Plan Summary: Daily contact with patient to assess and evaluate symptoms and progress in treatment and Medication management Plan of care: Continue inpatient admission. Discontinue Seroquel at this time for orthostatic hypotension.  Continue Mirtazapine 15 mg nightly for mood and insomnia. Start Trazodone 50 mg QHS for insomnia. Continue CIWA protocol for alcohol withdrawals. Atenolol moved to evening time per patient request.   Observation Level/Precautions:  15 minute checks  Laboratory:  Completed in ED  Psychotherapy:    Medications:    Consultations:    Discharge Concerns:    Estimated LOS:  Other:     Physician Treatment Plan for Primary Diagnosis: Severe recurrent major depression without psychotic features (HCC) Long Term Goal(s): Improvement in symptoms so as ready for discharge  Short Term Goals: Ability to identify changes in lifestyle to reduce recurrence of condition will improve, Ability to verbalize feelings will improve, Ability to disclose and discuss suicidal ideas, Ability to demonstrate self-control will improve, Ability to identify and develop effective coping behaviors will improve, Compliance with prescribed medications will improve and Ability to identify triggers associated with substance abuse/mental health issues will improve  Physician Treatment Plan for Secondary Diagnosis: Principal Problem:   Severe recurrent major depression without psychotic features (HCC) Active Problems:   Insomnia   Paroxysmal atrial fibrillation (HCC)   Tobacco abuse, episodic   Alcohol withdrawal delirium, acute, hyperactive (HCC)   Alcohol abuse  Long Term Goal(s): Improvement in symptoms so as ready for discharge  Short Term Goals: Ability to identify changes in lifestyle to reduce recurrence of condition will improve, Ability to verbalize feelings will improve, Ability to disclose and discuss suicidal ideas, Ability to demonstrate self-control will improve, Ability to identify and develop effective coping behaviors will improve, Compliance with prescribed medications will improve and Ability to identify triggers associated with substance abuse/mental health issues will improve  I certify that inpatient  services furnished can reasonably be expected to improve the patient's condition.    Jesse Sans, MD 11/5/202112:46 PM

## 2020-04-15 NOTE — Tx Team (Signed)
Interdisciplinary Treatment and Diagnostic Plan Update  04/15/2020 Time of Session: 9:00AM Shane Petersen MRN: [id number]  Principal Diagnosis: <principal problem not specified>  Secondary Diagnoses: Active Problems:   Severe recurrent major depression without psychotic features (HCC)   Current Medications:  Current Facility-Administered Medications  Medication Dose Route Frequency Provider Last Rate Last Admin  . acetaminophen (TYLENOL) tablet 650 mg  650 mg Oral Q6H PRN Clapacs, Christophere T, MD      . alum & mag hydroxide-simeth (MAALOX/MYLANTA) 200-200-20 MG/5ML suspension 30 mL  30 mL Oral Q4H PRN Clapacs, Tareek T, MD      . atenolol (TENORMIN) tablet 75 mg  75 mg Oral Daily Jesse Sans, MD   75 mg at 04/15/20 0758  . LORazepam (ATIVAN) injection 0-4 mg  0-4 mg Intravenous Q6H Clapacs, Jackquline Denmark, MD       Or  . LORazepam (ATIVAN) tablet 0-4 mg  0-4 mg Oral Q6H Clapacs, Kamen T, MD      . LORazepam (ATIVAN) injection 0-4 mg  0-4 mg Intravenous Q12H Clapacs, Jackquline Denmark, MD       Or  . LORazepam (ATIVAN) tablet 0-4 mg  0-4 mg Oral Q12H Clapacs, Courtenay T, MD      . magnesium hydroxide (MILK OF MAGNESIA) suspension 30 mL  30 mL Oral Daily PRN Clapacs, Yakub T, MD      . mirtazapine (REMERON) tablet 15 mg  15 mg Oral QHS Clapacs, Jackquline Denmark, MD   15 mg at 04/14/20 2110  . QUEtiapine (SEROQUEL) tablet 75 mg  75 mg Oral QHS Clapacs, Jackquline Denmark, MD   75 mg at 04/14/20 2110  . thiamine tablet 100 mg  100 mg Oral Daily Clapacs, Neale T, MD   100 mg at 04/15/20 0754   Or  . thiamine (B-1) injection 100 mg  100 mg Intravenous Daily Clapacs, Jackquline Denmark, MD       PTA Medications: Medications Prior to Admission  Medication Sig Dispense Refill Last Dose  . ARIPiprazole (ABILIFY) 2 MG tablet Take 2 mg by mouth daily. (Patient not taking: Reported on 04/13/2020)     . aspirin EC 81 MG tablet Take 81 mg by mouth daily. Swallow whole.     Marland Kitchen atenolol (TENORMIN) 25 MG tablet TAKE 3 TABLETS (75 MG TOTAL)  DAILY. (Patient  taking differently: Take 75 mg by mouth daily. ) 270 tablet 1   . atorvastatin (LIPITOR) 20 MG tablet TAKE 1 TABLET EVERY DAY (Patient taking differently: Take 20 mg by mouth daily. ) 90 tablet 1   . gabapentin (NEURONTIN) 100 MG capsule Take 300 mg by mouth at bedtime.     . mirtazapine (REMERON) 15 MG tablet Take 15 mg by mouth daily.     . Multiple Vitamin (MULTIVITAMIN) tablet Take 1 tablet by mouth daily.     . QUEtiapine (SEROQUEL) 25 MG tablet Take 75 mg by mouth at bedtime.        Patient Stressors:    Patient Strengths:    Treatment Modalities: Medication Management, Group therapy, Case management,  1 to 1 session with clinician, Psychoeducation, Recreational therapy.   Physician Treatment Plan for Primary Diagnosis: <principal problem not specified> Long Term Goal(s):     Short Term Goals:    Medication Management: Evaluate patient's response, side effects, and tolerance of medication regimen.  Therapeutic Interventions: 1 to 1 sessions, Unit Group sessions and Medication administration.  Evaluation of Outcomes: Not Progressing  Physician Treatment Plan for Secondary Diagnosis: Active  Problems:   Severe recurrent major depression without psychotic features (HCC)  Long Term Goal(s):     Short Term Goals:       Medication Management: Evaluate patient's response, side effects, and tolerance of medication regimen.  Therapeutic Interventions: 1 to 1 sessions, Unit Group sessions and Medication administration.  Evaluation of Outcomes: Not Progressing   RN Treatment Plan for Primary Diagnosis: <principal problem not specified> Long Term Goal(s): Knowledge of disease and therapeutic regimen to maintain health will improve  Short Term Goals: Ability to verbalize frustration and anger appropriately will improve, Ability to demonstrate self-control, Ability to participate in decision making will improve, Ability to verbalize feelings will improve, Ability to identify and  develop effective coping behaviors will improve and Compliance with prescribed medications will improve  Medication Management: RN will administer medications as ordered by provider, will assess and evaluate patient's response and provide education to patient for prescribed medication. RN will report any adverse and/or side effects to prescribing provider.  Therapeutic Interventions: 1 on 1 counseling sessions, Psychoeducation, Medication administration, Evaluate responses to treatment, Monitor vital signs and CBGs as ordered, Perform/monitor CIWA, COWS, AIMS and Fall Risk screenings as ordered, Perform wound care treatments as ordered.  Evaluation of Outcomes: Not Progressing   LCSW Treatment Plan for Primary Diagnosis: <principal problem not specified> Long Term Goal(s): Safe transition to appropriate next level of care at discharge, Engage patient in therapeutic group addressing interpersonal concerns.  Short Term Goals: Engage patient in aftercare planning with referrals and resources, Increase social support, Increase ability to appropriately verbalize feelings, Increase emotional regulation, Facilitate acceptance of mental health diagnosis and concerns, Identify triggers associated with mental health/substance abuse issues and Increase skills for wellness and recovery  Therapeutic Interventions: Assess for all discharge needs, 1 to 1 time with Social worker, Explore available resources and support systems, Assess for adequacy in community support network, Educate family and significant other(s) on suicide prevention, Complete Psychosocial Assessment, Interpersonal group therapy.  Evaluation of Outcomes: Not Progressing   Progress in Treatment: Attending groups: No. Participating in groups: No. Taking medication as prescribed: Yes. Toleration medication: Yes. Family/Significant other contact made: Yes, individual(s) contacted:  Shane Petersen, son Patient understands diagnosis:  Yes. Discussing patient identified problems/goals with staff: Yes. Medical problems stabilized or resolved: Yes. Denies suicidal/homicidal ideation: Yes. Issues/concerns per patient self-inventory: No. Other: None.  New problem(s) identified: No, Describe:  none.  New Short Term/Long Term Goal(s): detox, medication management for mood stabilization; development of comprehensive mental wellness/sobriety plan.  Patient Goals: "I want to go home."    Discharge Plan or Barriers: Pt plans to return home without follow up care as he feels there is nothing wrong with him.  Reason for Continuation of Hospitalization: Medication stabilization Withdrawal symptoms  Estimated Length of Stay: 1-7 days  Attendees: Patient: STAVROS CAIL 04/15/2020 10:51 AM  Physician: Les Pou, MD 04/15/2020 10:51 AM  Nursing: Torrie Mayers, RN 04/15/2020 10:51 AM  RN Care Manager: 04/15/2020 10:51 AM  Social Worker: Penni Homans, MSW, LCSW 04/15/2020 10:51 AM  Recreational Therapist: Garret Reddish, Drue Flirt, LRT  04/15/2020 10:51 AM  Other: Vilma Meckel. Algis Greenhouse, MSW, LCSW, LCAS 04/15/2020 10:51 AM  Other:  04/15/2020 10:51 AM  Other: 04/15/2020 10:51 AM    Scribe for Treatment Team: Glenis Smoker, LCSW 04/15/2020 10:51 AM

## 2020-04-15 NOTE — BHH Counselor (Signed)
Adult Comprehensive Assessment  Patient ID: Shane Petersen, male   DOB: September 21, 1950, 69 y.o.   MRN: [id number]  Information Source: Information source: Patient  Current Stressors:  Patient states their primary concerns and needs for treatment are:: "they thought I was drinking too much. I was dizzy from sleep medications and skinned by knee and have a gash above my brow." Patient states their goals for this hospitilization and ongoing recovery are:: "Go home" Educational / Learning stressors: Pt denies. Employment / Job issues: Pt denies. Family Relationships: Pt denies. Financial / Lack of resources (include bankruptcy): "I live on my social security" Housing / Lack of housing: Pt denies. Physical health (include injuries & life threatening diseases): Pt reports that he has high blood pressure that is managed with medications. Social relationships: Pt denies. Substance abuse: Pt reports "alcohol". Bereavement / Loss: Pt denies.  Living/Environment/Situation:  Living Arrangements: Alone How long has patient lived in current situation?: "5-10 years" What is atmosphere in current home: Comfortable  Family History:  Marital status: Divorced Divorced, when?: "long time ago, 15 years ago" Does patient have children?: Yes How many children?: 4 How is patient's relationship with their children?: "fine, they all live in other places, one in [place] and two in [place]"  Childhood History:  By whom was/is the patient raised?: Both parents Description of patient's relationship with caregiver when they were a child: "fine" Patient's description of current relationship with people who raised him/her: Pt reports that his parents are deceased. How were you disciplined when you got in trouble as a child/adolescent?: "I didn't" Does patient have siblings?: Yes Number of Siblings: 1 Description of patient's current relationship with siblings: "fine" Did patient suffer any  verbal/emotional/physical/sexual abuse as a child?: No Did patient suffer from severe childhood neglect?: No Has patient ever been sexually abused/assaulted/raped as an adolescent or adult?: No Was the patient ever a victim of a crime or a disaster?: No Witnessed domestic violence?: No Has patient been affected by domestic violence as an adult?: No  Education:  Highest grade of school patient has completed: Geophysicist/field seismologist school and residencies" Currently a student?: No Learning disability?: No  Employment/Work Situation:   Employment situation: Retired Therapist, 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 job?: "30 years" 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" Has patient ever been in the [employer]?: No  Financial Resources:   Surveyor, quantity resources: Writer, Medicare Does patient have a Lawyer or guardian?: No  Alcohol/Substance Abuse:   What has been your use of drugs/alcohol within the last 12 months?: Alcohol: "5 beers a day, one in the morning, two in the day and two at night" pt reports that this has done this "a long time" If attempted suicide, did drugs/alcohol play a role in this?: No Alcohol/Substance Abuse Treatment Hx: Denies past history Has alcohol/substance abuse ever caused legal problems?: No  Social Support System:   Conservation officer, nature Support System: Fair Development worker, community Support System: "son" Type of faith/religion: "Episcopalian" How does patient's faith help to cope with current illness?: "I pray.  I haven't been to Covid in years, since the start of Covid"  Leisure/Recreation:   Do You Have Hobbies?: No  Strengths/Needs:   What is the patient's perception of their strengths?: "I get along" Patient states they can use these personal strengths during their treatment to contribute to their recovery: Pt denies. Patient states these barriers may affect/interfere with their treatment: Pt denies. Patient states these barriers may  affect their return to the community: Pt denies.  Discharge Plan:   Currently receiving community mental health services: No Patient states concerns and preferences for aftercare planning are: Pt reports that he does not need follow up mental health care. Patient states they will know when they are safe and ready for discharge when: "because I feel that way" Does patient have access to transportation?: No Does patient have financial barriers related to discharge medications?: No Plan for no access to transportation at discharge: CSW will assist with transportation needs. Will patient be returning to same living situation after discharge?: Yes  Summary/Recommendations:   Summary and Recommendations (to be completed by the evaluator): Patient is a 69 year old male from [place] [facility]  He presents to the hospital following concerns from his PCP and his son about the patient's ability to care for himself and increased alcohol use.   Recommendations include: crisis stabilization, therapeutic milieu, encourage group attendance and participation, medication management for detox/mood stabilization and development of comprehensive mental wellness/sobriety plan.  [name]. 04/15/2020

## 2020-04-15 NOTE — B[facility] Suicide Risk Assessment (Signed)
[facility] INPATIENT:  Family/Significant Other Suicide Prevention Education  Suicide Prevention Education:  Education Completed; Shane Petersen, son, [phone], has been identified by the patient as the family member/significant other with whom the patient will be residing, and identified as the person(s) who will aid the patient in the event of a mental health crisis (suicidal ideations/suicide attempt).  With written consent from the patient, the family member/significant other has been provided the following suicide prevention education, prior to the and/or following the discharge of the patient.  The suicide prevention education provided includes the following:  Suicide risk factors  Suicide prevention and interventions  National Suicide Hotline telephone number  [facility] assessment telephone number  [facility] Emergency Assistance 911  [facility] and/or Residential Mobile Crisis Unit telephone number  Request made of family/significant other to:  Remove weapons (e.g., guns, rifles, knives), all items previously/currently identified as safety concern.    Remove drugs/medications (over-the-counter, prescriptions, illicit drugs), all items previously/currently identified as a safety concern.  The family member/significant other verbalizes understanding of the suicide prevention education information provided.  The family member/significant other agrees to remove the items of safety concern listed above.  Son reports that the he is the patient's healthcare POA.  He reports that the patient had fell on the porch "the neighbors found him and he had been pretty drunk at this time".  He reports that he and his brother came to town to check on the patient and found him "at some point he had lost control of his bowels and there was feces everywhere".  He reports "he continued to drink throughout the night". He reports that the patient "made odd grunting or growling  noises".  He reports that the patient "fixed a meal but wouldn't let it cool and was choking as he was eating it". He reports that he returned home and came back to visit his father and found that he "had a gash from a fall and in the same clothes from the week before".  He reports that the patient hadn't "touched any of the food that we had left him".  Son reports he drink until he passes out.  He reports that his PCP was concerned about the pt's "low blood pressure, low weight and overall condition". He reports that patient "is at a point where he can't or wont care for himself and I think that it's in part because he is drunk all the time". Son reports that his goal is to "clear the fog, assess for dementia and get him from hospital to treatment facility for alcohol".  CSW explained that patient can stay for detox, however, can not be forced into treatment nor demential assessment be completed.  Shane Petersen 04/15/2020, 10:41 AM

## 2020-04-15 NOTE — BHH Suicide Risk Assessment (Signed)
[facility] Admission Suicide Risk Assessment   Nursing information obtained from:  Patient Demographic factors:  Age 69 or older, Caucasian Current Mental Status:  NA Loss Factors:  Decline in physical health Historical Factors:  NA Risk Reduction Factors:  Sense of responsibility to family, Positive social support, Positive therapeutic relationship  Total Time spent with patient: 45 minutes Principal Problem: Severe recurrent major depression without psychotic features (HCC) Diagnosis:  Principal Problem:   Severe recurrent major depression without psychotic features (HCC) Active Problems:   Insomnia   Paroxysmal atrial fibrillation (HCC)   Tobacco abuse, episodic   Alcohol withdrawal delirium, acute, hyperactive (HCC)   Alcohol abuse  Subjective Data: Patient seen and assessed during treatment team and again one-on-one today. He continues to request to go home. He denies suicidal ideations, homicidal ideations, visual hallucinations, and auditory hallucinations. He continues to insist he only drinks 5 beers a day which is only enough to relax him, and he denies getting drunk. He currently denies withdrawal symptoms, and denies history of DTs or seizures with alcohol withdrawals. When asked about his falls at home, he attributes this to Seroquel. He notes that he has been treated for depression by Dr. Raj Janus. He has been on mirtazpine for several years, and has found this beneficial. He was briefly on Abilify, but this was discontinued due to cost. He has not noticed worsening depression or anxiety since stopping this medication. He states he was on seroquel for insomnia. He has previously tried [place], but not other sleep aids. He is open to discontinuing Seroquel and starting Trazodone as alternate treatment for insomnia with less risk of orthostatic hypotension. He also agreed to complete [facility] with me and scored a 24/30. He also gives permission for me to speak to his son and physicians.   Spoke  with son, Annette Stable, [phone]. Updated son on MOCA score, current status in the hospital, and medication changes. He notes that his father has minimized his alcohol use for several years. However, he begins drinking at 10AM, and gets intoxicated to the point where he speaks "gibberish", falls over, becomes fecally incontinent, and yells at others. He previously had a friend that would come over and take him out to dinner every Thursday night, but they had to stop doing so because he would be intoxicated to point of falling by 5Pm arrival. He would also yell at [place] staff at the restaurants. Son reports that last year at Thanksgiving he was intoxicated to point he nearly fell on [place], and past Christmas fell over at sons house. They have been attempting to coordinate with Dr. Raj Janus for substance abuse treatment for some years.   Message sent to Dr. Darrick Huntsman to update on hospital course, medication changes, and plan of care as well. She notes above concerns for minimization of alcohol use disorder.   Continued Clinical Symptoms:  Alcohol Use Disorder Identification Test Final Score (AUDIT): 10 The "Alcohol Use Disorders Identification Test", Guidelines for Use in Primary Care, Second Edition.  World Science writer [facility]). Score between 0-7:  no or low risk or alcohol related problems. Score between 8-15:  moderate risk of alcohol related problems. Score between 16-19:  high risk of alcohol related problems. Score 20 or above:  warrants further diagnostic evaluation for alcohol dependence and treatment.   CLINICAL FACTORS:   Depression:   Comorbid alcohol abuse/dependence Hopelessness Impulsivity Insomnia Severe Alcohol/Substance Abuse/Dependencies More than one psychiatric diagnosis Previous Psychiatric Diagnoses and Treatments   Musculoskeletal: Strength & Muscle Tone: within normal  limits Gait & Station: normal Patient leans: N/A  Psychiatric Specialty Exam: Physical Exam Vitals and  nursing note reviewed.  Constitutional:      Appearance: Normal appearance.  HENT:     Head: Normocephalic.      Right Ear: External ear normal.     Left Ear: External ear normal.     Nose: Nose normal.     Mouth/Throat:     Mouth: Mucous membranes are moist.     Pharynx: Oropharynx is clear.  Eyes:     Extraocular Movements: Extraocular movements intact.     Conjunctiva/sclera: Conjunctivae normal.     Pupils: Pupils are equal, round, and reactive to light.  Cardiovascular:     Rate and Rhythm: Normal rate.     Pulses: Normal pulses.  Pulmonary:     Effort: Pulmonary effort is normal.     Breath sounds: Normal breath sounds.  Abdominal:     General: Abdomen is flat.     Palpations: Abdomen is soft.  Musculoskeletal:        General: No swelling. Normal range of motion.     Cervical back: Normal range of motion and neck supple.  Skin:    General: Skin is warm.     Findings: Lesion present.  Neurological:     General: No focal deficit present.     Mental Status: He is alert and oriented to person, place, and time.  Psychiatric:        Attention and Perception: Attention and perception normal.        Mood and Affect: Mood is depressed. Affect is angry.        Speech: Speech normal.        Behavior: Behavior is agitated.        Thought Content: Thought content does not include homicidal or suicidal ideation.        Cognition and Memory: Cognition and memory normal.        Judgment: Judgment is impulsive.     Review of Systems  Constitutional: Positive for appetite change and unexpected weight change.  HENT: Negative for rhinorrhea and sore throat.   Eyes: Negative for photophobia and visual disturbance.  Respiratory: Negative for cough and shortness of breath.   Cardiovascular: Negative for chest pain and palpitations.  Gastrointestinal: Negative for constipation, diarrhea, nausea and vomiting.  Endocrine: Negative for cold intolerance and heat intolerance.    Genitourinary: Negative for difficulty urinating and dysuria.  Musculoskeletal: Negative for arthralgias and myalgias.  Skin: Negative for rash and wound.  Allergic/Immunologic: Negative for environmental allergies and food allergies.  Neurological: Positive for dizziness, syncope and light-headedness.  Hematological: Negative for adenopathy. Does not bruise/bleed easily.  Psychiatric/Behavioral: Positive for behavioral problems, confusion and sleep disturbance.    Blood pressure (!) 141/84, pulse (!) 58, temperature 98.4 F (36.9 C), temperature source Oral, resp. rate 17, height 5' 8.9" (1.75 m), weight 67.6 kg, SpO2 100 %.Body mass index is 22.07 kg/m.  General Appearance: Disheveled  Eye Contact:  Fair  Speech:  Clear and Coherent  Volume:  Normal  Mood:  Depressed  Affect:  Congruent  Thought Process:  Coherent and Linear  Orientation:  Full (Time, Place, and Person)  Thought Content:  Logical  Suicidal Thoughts:  No  Homicidal Thoughts:  No  Memory:  Immediate;   Fair Recent;   Fair Remote;   Fair  Judgement:  Impaired  Insight:  Lacking  Psychomotor Activity:  Normal  Concentration:  Concentration: Good and Attention  Span: Good  Recall:  [employer] of Knowledge:  Fair  Language:  Good  Akathisia:  Negative  Handed:  Right  AIMS (if indicated):     Assets:  Communication Skills Desire for Improvement Financial Resources/Insurance Housing Social Support Talents/Skills  ADL's:  Intact  Cognition:  WNL  Sleep:  Number of Hours: 6      COGNITIVE FEATURES THAT CONTRIBUTE TO RISK:  Closed-mindedness    SUICIDE RISK:   Moderate:  Frequent suicidal ideation with limited intensity, and duration, some specificity in terms of plans, no associated intent, good self-control, limited dysphoria/symptomatology, some risk factors present, and identifiable protective factors, including available and accessible social support.  PLAN OF CARE: Continue inpatient admission.  Discontinue Seroquel at this time for orthostatic hypotension. Continue Mirtazapine 15 mg nightly for mood and insomnia. Start Trazodone 50 mg QHS for insomnia. Continue CIWA protocol for alcohol withdrawals. Atenolol moved to evening time per patient request.   I certify that inpatient services furnished can reasonably be expected to improve the patient's condition.   Jesse Sans, MD 04/15/2020, 12:26 PM

## 2020-04-15 NOTE — BHH Group Notes (Signed)
LCSW Group Therapy Note  04/15/2020 2:47 PM  Type of Therapy/Topic:  Group Therapy:  Emotion Regulation  Participation Level:  Did Not Attend   Description of Group:   The purpose of this group is to assist patients in learning to regulate negative emotions and experience positive emotions. Patients will be guided to discuss ways in which they have been vulnerable to their negative emotions. These vulnerabilities will be juxtaposed with experiences of positive emotions or situations, and patients will be challenged to use positive emotions to combat negative ones. Special emphasis will be placed on coping with negative emotions in conflict situations, and patients will process healthy conflict resolution skills.  Therapeutic Goals: 1. Patient will identify two positive emotions or experiences to reflect on in order to balance out negative emotions 2. Patient will label two or more emotions that they find the most difficult to experience 3. Patient will demonstrate positive conflict resolution skills through discussion and/or role plays  Summary of Patient Progress: X  Therapeutic Modalities:   Cognitive Behavioral Therapy Feelings Identification Dialectical Behavioral Therapy  Penni Homans, MSW, LCSW 04/15/2020 2:47 PM

## 2020-04-15 NOTE — Progress Notes (Signed)
Met with patient again this afternoon to discuss conversations with son and physicians. He continues to state he has a beer at 10AM, 2 beers around 2:30, and another at 5pm. He denies allegations that he was intoxicated around family at Thanksgiving, Christmas, or at home. He continues to state he was dizzy only because of his sleep medication. He also states that his friend continues to do Thursday night dinners. He states he will say he will stop drinking if he can leave the hospital, and that he was planning to cut down anyway. He declines FDA approved medications for alcohol use disorder stating he does not have cravings or addiction problems. He is also not open to [place] meetings at this time. Reviewed plan to continue admission, discontinue Seroquel, and start Trazodone for sleep tonight. Patient continues to request discharge today, but remains calm and cooperative. He also continues to decline nicotine patch or gum stating he just wants a real cigarette.

## 2020-04-15 NOTE — Progress Notes (Addendum)
Recreation Therapy Notes  INPATIENT RECREATION THERAPY ASSESSMENT  Patient Details Name: Shane Petersen MRN: [id number] DOB: [birth year] Today's Date: 04/15/2020       Information Obtained From: Patient  Able to Participate in Assessment/Interview: Yes  Patient Presentation: Responsive  Reason for Admission (Per Patient): N/A  Patient Stressors:    Coping Skills:   Prayer, Read  Leisure Interests (2+):  Individual - TV, Individual - Reading  Frequency of Recreation/Participation: Monthly  Awareness of Community Resources:     [employer]:     Current Use:    If no, Barriers?:    Expressed Interest in [employer] Information:    [place] of Residence:  Film/video editor  Patient Main Form of Transportation: Set designer  Patient Strengths:  Publishing copy, Optometrist  Patient Identified Areas of Improvement:  Nothing  Patient Goal for Hospitalization:  Getting the hell out  Current SI (including self-harm):  No  Current HI:  No  Current AVH: No  Staff Intervention Plan: Group Attendance, Collaborate with Interdisciplinary Treatment Team  Consent to Intern Participation: N/A  [place] Kuang 04/15/2020, 1:13 PM

## 2020-04-15 NOTE — Progress Notes (Signed)
The patient denies most symptoms of withdrawal but his hands are tremulous and his skin feels clammy. He says he does not feel he is in any danger of withdrawal because it has been five days since his last drink and he only drank five beers per day. Voices no other complaints. Received Ativan 2 mg at 2015 and is currently asleep.

## 2020-04-15 NOTE — Progress Notes (Signed)
Recreation Therapy Notes   Date: 04/15/2020  Time: 9:30 am   Location: Craft room     Behavioral response: N/A   Intervention Topic: Emotions   Discussion/Intervention: Patient did not attend group.   Clinical Observations/Feedback:  Patient did not attend group.   Denetra Formoso LRT/CTRS           Meta Kroenke 04/15/2020 11:56 AM

## 2020-04-15 NOTE — BHH Counselor (Signed)
Pt declined aftercare referrals at this time, stating that it was not needed.  Penni Homans, MSW, LCSW 04/15/2020 11:00 AM

## 2020-04-15 NOTE — Plan of Care (Signed)
Patient got up this morning and was active in the milieu. Stayed around with peers and maintained appropriate behavior. Ate breakfast and received some of his AM Medications. Refusing Ativan saying that he does not need it "I am not planning to take it". Continues to request to be discharged. Patient is otherwise cooperative. Safety monitored as expected.

## 2020-04-16 DIAGNOSIS — F1010 Alcohol abuse, uncomplicated: Secondary | ICD-10-CM

## 2020-04-16 NOTE — Progress Notes (Signed)
Patient just came to ask "when can I go home, I'm just sitting here, not getting any treatment, no nothing". This Clinical research associate informed patient that he would have to discuss discharge with the doctor. This Clinical research associate also informed patient that patients usually don't discharge on the weekend if it hasn't already been set up prior to the weekend.

## 2020-04-16 NOTE — Plan of Care (Signed)
D- Patient alert and oriented. Patient presents in a pleasant mood on assessment stating that he slept ok last night and had no complaints to voice to this Clinical research associate. Patient denies anxiety, but stated that "being in here doesn't make me any happier". Patient also denies SI, HI, AVH, and pain at this time. Patient had no stated goals for today, however, he has been observed, on several occassions, sitting quietly, reading the bible in his room.  A- Scheduled medications administered to patient, per MD orders. Support and encouragement provided.  Routine safety checks conducted every 15 minutes.  Patient informed to notify staff with problems or concerns.  R- No adverse drug reactions noted. Patient contracts for safety at this time. Patient compliant with medications. Patient receptive, calm, and cooperative. Patient interacts well with others on the unit.  Patient remains safe at this time.  Problem: Education: Goal: Knowledge of [facility] Education information/materials will improve Outcome: Progressing Goal: Emotional status will improve Outcome: Progressing Goal: Mental status will improve Outcome: Progressing Goal: Verbalization of understanding the information provided will improve Outcome: Progressing   Problem: Activity: Goal: Interest or engagement in activities will improve Outcome: Progressing Goal: Sleeping patterns will improve Outcome: Progressing   Problem: Coping: Goal: Ability to verbalize frustrations and anger appropriately will improve Outcome: Progressing Goal: Ability to demonstrate self-control will improve Outcome: Progressing   Problem: Health Behavior/Discharge Planning: Goal: Identification of resources available to assist in meeting health care needs will improve Outcome: Progressing Goal: Compliance with treatment plan for underlying cause of condition will improve Outcome: Progressing   Problem: Physical Regulation: Goal: Ability to maintain  clinical measurements within normal limits will improve Outcome: Progressing   Problem: Safety: Goal: Periods of time without injury will increase Outcome: Progressing   Problem: Education: Goal: Utilization of techniques to improve thought processes will improve Outcome: Progressing Goal: Knowledge of the prescribed therapeutic regimen will improve Outcome: Progressing   Problem: Activity: Goal: Interest or engagement in leisure activities will improve Outcome: Progressing Goal: Imbalance in normal sleep/wake cycle will improve Outcome: Progressing   Problem: Coping: Goal: Coping ability will improve Outcome: Progressing Goal: Will verbalize feelings Outcome: Progressing   Problem: Health Behavior/Discharge Planning: Goal: Ability to make decisions will improve Outcome: Progressing Goal: Compliance with therapeutic regimen will improve Outcome: Progressing   Problem: Role Relationship: Goal: Will demonstrate positive changes in social behaviors and relationships Outcome: Progressing   Problem: Safety: Goal: Ability to disclose and discuss suicidal ideas will improve Outcome: Progressing Goal: Ability to identify and utilize support systems that promote safety will improve Outcome: Progressing   Problem: Self-Concept: Goal: Will verbalize positive feelings about self Outcome: Progressing Goal: Level of anxiety will decrease Outcome: Progressing   Problem: Education: Goal: Knowledge of disease or condition will improve Outcome: Progressing Goal: Understanding of discharge needs will improve Outcome: Progressing   Problem: Health Behavior/Discharge Planning: Goal: Ability to identify changes in lifestyle to reduce recurrence of condition will improve Outcome: Progressing Goal: Identification of resources available to assist in meeting health care needs will improve Outcome: Progressing   Problem: Physical Regulation: Goal: Complications related to the disease  process, condition or treatment will be avoided or minimized Outcome: Progressing   Problem: Safety: Goal: Ability to remain free from injury will improve Outcome: Progressing

## 2020-04-16 NOTE — [facility] Group Notes (Signed)
[facility] Group Notes: (Clinical Social Work)   04/16/2020      Type of Therapy:  Group Therapy   Participation Level:  Did Not Attend - was invited individually by Nurse/MHT and chose not to attend.   Susa Simmonds, LCSWA 04/16/2020  2:41 PM

## 2020-04-16 NOTE — Progress Notes (Signed)
[facility] MD Progress Note  04/16/2020 10:18 AM Shane Petersen  MRN:  [id number] Subjective:   I'm doing fine, when can I go home?"  Pt seen for follow up. Chart reviewed. CIWA was 11 yesterday, pt received 2 mg ativan yesterday at 2015 for tremor, clammy skin, anxiety. pt still has tremors. pt denies any other withdrawal symptoms, denies alcohol problem although admits drinking 5 beers a day.  Principal Problem: Severe recurrent major depression without psychotic features (HCC) Diagnosis: Principal Problem:   Severe recurrent major depression without psychotic features (HCC) Active Problems:   Insomnia   Paroxysmal atrial fibrillation (HCC)   Tobacco abuse, episodic   Alcohol withdrawal delirium, acute, hyperactive (HCC)   Alcohol abuse  Total Time spent with patient: 30 minutes  Past Psychiatric History: see H& P  Past Medical History:  Past Medical History:  Diagnosis Date  . Depression   . Hyperlipidemia   . Hypertension   . Paroxysmal atrial fibrillation (HCC)   . Ulcerative colitis (HCC)    History reviewed. No pertinent surgical history. Family History:  Family History  Problem Relation Age of Onset  . Cancer Neg Hx   . Heart disease Neg Hx    Family Psychiatric  History: see H & P Social History:  Social History   Substance and Sexual Activity  Alcohol Use Yes  . Alcohol/week: 21.0 standard drinks  . Types: 21 Standard drinks or equivalent per week     Social History   Substance and Sexual Activity  Drug Use No    Social History   Socioeconomic History  . Marital status: Divorced    Spouse name: Not on file  . Number of children: Not on file  . Years of education: Not on file  . Highest education level: Not on file  Occupational History  . Not on file  Tobacco Use  . Smoking status: Current Every Day Smoker    Types: Cigarettes  . Smokeless tobacco: Never Used  Substance and Sexual Activity  . Alcohol use: Yes    Alcohol/week: 21.0 standard drinks     Types: 21 Standard drinks or equivalent per week  . Drug use: No  . Sexual activity: Not Currently    Partners: Female  Other Topics Concern  . Not on file  Social History Narrative  . Not on file   Social Determinants of Health   Financial Resource Strain:   . Difficulty of Paying Living Expenses: Not on file  Food Insecurity:   . Worried About Programme researcher, broadcasting/film/video in the Last Year: Not on file  . Ran Out of Food in the Last Year: Not on file  Transportation Needs:   . Lack of Transportation (Medical): Not on file  . Lack of Transportation (Non-Medical): Not on file  Physical Activity:   . Days of Exercise per Week: Not on file  . Minutes of Exercise per Session: Not on file  Stress:   . Feeling of Stress : Not on file  Social Connections:   . Frequency of Communication with Friends and Family: Not on file  . Frequency of Social Gatherings with Friends and Family: Not on file  . Attends Religious Services: Not on file  . Active Member of Clubs or Organizations: Not on file  . Attends Banker Meetings: Not on file  . Marital Status: Not on file   Additional Social History:  Sleep: Fair  Appetite:  Fair  Current Medications: Current Facility-Administered Medications  Medication Dose Route Frequency Provider Last Rate Last Admin  . acetaminophen (TYLENOL) tablet 650 mg  650 mg Oral Q6H PRN Clapacs, Dannie T, MD      . alum & mag hydroxide-simeth (MAALOX/MYLANTA) 200-200-20 MG/5ML suspension 30 mL  30 mL Oral Q4H PRN Clapacs, Faust T, MD      . atenolol (TENORMIN) tablet 75 mg  75 mg Oral QPM Jesse Sans, MD      . LORazepam (ATIVAN) injection 0-4 mg  0-4 mg Intravenous Q6H Clapacs, Jackquline Denmark, MD       Or  . LORazepam (ATIVAN) tablet 0-4 mg  0-4 mg Oral Q6H Clapacs, Orel T, MD      . LORazepam (ATIVAN) injection 0-4 mg  0-4 mg Intravenous Q12H Clapacs, Jackquline Denmark, MD       Or  . LORazepam (ATIVAN) tablet 0-4 mg  0-4 mg Oral  Q12H Clapacs, Jackquline Denmark, MD   2 mg at 04/15/20 2005  . magnesium hydroxide (MILK OF MAGNESIA) suspension 30 mL  30 mL Oral Daily PRN Clapacs, [place] T, MD      . mirtazapine (REMERON) tablet 15 mg  15 mg Oral QHS Clapacs, Jackquline Denmark, MD   15 mg at 04/14/20 2110  . thiamine tablet 100 mg  100 mg Oral Daily Clapacs, Mart T, MD   100 mg at 04/16/20 1191   Or  . thiamine (B-1) injection 100 mg  100 mg Intravenous Daily Clapacs, Brylee T, MD      . traZODone (DESYREL) tablet 50 mg  50 mg Oral QHS Jesse Sans, MD      . traZODone (DESYREL) tablet 50 mg  50 mg Oral QHS PRN Jesse Sans, MD        Lab Results: No results found for this or any previous visit (from the past 48 hour(s)).  Blood Alcohol level:  Lab Results  Component Value Date   ETH <10 04/13/2020    Metabolic Disorder Labs: Lab Results  Component Value Date   HGBA1C 5.6 04/13/2020   MPG 114.02 04/13/2020   No results found for: PROLACTIN Lab Results  Component Value Date   CHOL 137 04/13/2020   TRIG 93 04/13/2020   HDL 45 04/13/2020   CHOLHDL 3.0 04/13/2020   VLDL 19 04/13/2020   LDLCALC 73 04/13/2020   LDLCALC 60 11/27/2011    Physical Findings: AIMS: Facial and Oral Movements Muscles of Facial Expression: None, normal Lips and Perioral Area: None, normal Jaw: None, normal Tongue: None, normal,Extremity Movements Upper (arms, wrists, hands, fingers): None, normal Lower (legs, knees, ankles, toes): None, normal, Trunk Movements Neck, shoulders, hips: None, normal, Overall Severity Severity of abnormal movements (highest score from questions above): None, normal Incapacitation due to abnormal movements: None, normal Patient's awareness of abnormal movements (rate only patient's report): No Awareness, Dental Status Current problems with teeth and/or dentures?: No Does patient usually wear dentures?: No  CIWA:  CIWA-Ar Total: 0 COWS:     Musculoskeletal: Strength & Muscle Tone: within normal limits Gait &  Station: normal Patient leans:   Psychiatric Specialty Exam: Physical Exam Constitutional:      Appearance: Normal appearance.  HENT:     Head: Normocephalic.     Mouth/Throat:     Mouth: Mucous membranes are moist.  Pulmonary:     Effort: Pulmonary effort is normal.  Musculoskeletal:        General: Normal range  of motion.  Neurological:     General: No focal deficit present.     Mental Status: He is alert and oriented to person, place, and time.     Review of Systems  Blood pressure 121/78, pulse 66, temperature 98.4 F (36.9 C), resp. rate 17, height 5' 8.9" (1.75 m), weight 67.6 kg, SpO2 100 %.Body mass index is 22.07 kg/m.  General Appearance: Disheveled  Eye Contact:  Fair  Speech:  Normal Rate  Volume:  Normal  Mood:  Anxious and Dysphoric  Affect:  Constricted  Thought Process:  Goal Directed  Orientation:  Full (Time, Place, and Person)  Thought Content:  Denies SI/HI, no evidence of psychosis  Suicidal Thoughts:  No  Homicidal Thoughts:  No  Memory: grossly Intact   Judgement:  Fair  Insight:  Shallow  Psychomotor Activity:  Normal  Concentration:  fair  Recall:  Good  Fund of Knowledge:  Good  Language:  Good  Akathisia:  Negative  Handed:    AIMS (if indicated):     Assets:    ADL's:  Intact  Cognition:  WNL  Sleep:  Number of Hours: 8.25     Treatment Plan Summary: Daily contact with patient to assess and evaluate symptoms and progress in treatment and Medication management . Plan Plan of care: Continue inpatient admission. Discontinue Seroquel at this time for orthostatic hypotension. Continue Mirtazapine 15 mg nightly for mood and insomnia. Trazodone 50 mg QHS for insomnia. Continue CIWA protocol for alcohol withdrawals. Atenolol moved to evening time per patient request. lives enzymes slightly elevated, will monitor.  Observation Level/Precautions:  15 minute checks  Laboratory:  Completed in ED  Psychotherapy:    Medications:     Consultations:    Discharge Concerns:    Estimated LOS:  Other:     Physician Treatment Plan for Primary Diagnosis: Severe recurrent major depression without psychotic features (HCC) Long Term Goal(s): Improvement in symptoms so as ready for discharge  Short Term Goals: Ability to identify changes in lifestyle to reduce recurrence of condition will improve, Ability to verbalize feelings will improve, Ability to disclose and discuss suicidal ideas, Ability to demonstrate self-control will improve, Ability to identify and develop effective coping behaviors will improve, Compliance with prescribed medications will improve and Ability to identify triggers associated with substance abuse/mental health issues will improve  Physician Treatment Plan for Secondary Diagnosis: Principal Problem:   Severe recurrent major depression without psychotic features (HCC) Active Problems:   Insomnia   Paroxysmal atrial fibrillation (HCC)   Tobacco abuse, episodic   Alcohol withdrawal delirium, acute, hyperactive (HCC)   Alcohol abuse  Long Term Goal(s): Improvement in symptoms so as ready for discharge  Short Term Goals: Ability to identify changes in lifestyle to reduce recurrence of condition will improve, Ability to verbalize feelings will improve, Ability to disclose and discuss suicidal ideas, Ability to demonstrate self-control will improve, Ability to identify and develop effective coping behaviors will improve, Compliance with prescribed medications will improve and Ability to identify triggers associated with substance abuse/mental health issues will improve  I certify that inpatient services furnished can reasonably be expected to improve the patient's condition.     Beverly Sessions, MD 04/16/2020, 10:18 AM

## 2020-04-16 NOTE — Progress Notes (Signed)
Patient reported not experiencing any withdrawal symptoms. Patient vitals WNL. This Clinical research associate did not administer scheduled Ativan.

## 2020-04-17 MED ORDER — TRAZODONE HCL 50 MG PO TABS
50.0000 mg | ORAL_TABLET | Freq: Every day | ORAL | Status: AC
  Administered 2020-04-17: 50 mg via ORAL
  Filled 2020-04-17: qty 1

## 2020-04-17 NOTE — Plan of Care (Signed)
  Problem: Education: Goal: Knowledge of  General Education information/materials will improve Outcome: Progressing Goal: Emotional status will improve Outcome: Progressing Goal: Mental status will improve Outcome: Progressing Goal: Verbalization of understanding the information provided will improve Outcome: Progressing   Problem: Activity: Goal: Interest or engagement in activities will improve Outcome: Progressing Goal: Sleeping patterns will improve Outcome: Progressing   Problem: Coping: Goal: Ability to verbalize frustrations and anger appropriately will improve Outcome: Progressing Goal: Ability to demonstrate self-control will improve Outcome: Progressing   Problem: Health Behavior/Discharge Planning: Goal: Identification of resources available to assist in meeting health care needs will improve Outcome: Progressing Goal: Compliance with treatment plan for underlying cause of condition will improve Outcome: Progressing   Problem: Physical Regulation: Goal: Ability to maintain clinical measurements within normal limits will improve Outcome: Progressing   Problem: Safety: Goal: Periods of time without injury will increase Outcome: Progressing   Problem: Education: Goal: Utilization of techniques to improve thought processes will improve Outcome: Progressing Goal: Knowledge of the prescribed therapeutic regimen will improve Outcome: Progressing   Problem: Activity: Goal: Interest or engagement in leisure activities will improve Outcome: Progressing Goal: Imbalance in normal sleep/wake cycle will improve Outcome: Progressing   Problem: Coping: Goal: Coping ability will improve Outcome: Progressing Goal: Will verbalize feelings Outcome: Progressing   Problem: Health Behavior/Discharge Planning: Goal: Ability to make decisions will improve Outcome: Progressing Goal: Compliance with therapeutic regimen will improve Outcome: Progressing    Problem: Role Relationship: Goal: Will demonstrate positive changes in social behaviors and relationships Outcome: Progressing   Problem: Safety: Goal: Ability to disclose and discuss suicidal ideas will improve Outcome: Progressing Goal: Ability to identify and utilize support systems that promote safety will improve Outcome: Progressing   Problem: Self-Concept: Goal: Will verbalize positive feelings about self Outcome: Progressing Goal: Level of anxiety will decrease Outcome: Progressing   Problem: Education: Goal: Knowledge of disease or condition will improve Outcome: Progressing Goal: Understanding of discharge needs will improve Outcome: Progressing   Problem: Health Behavior/Discharge Planning: Goal: Ability to identify changes in lifestyle to reduce recurrence of condition will improve Outcome: Progressing Goal: Identification of resources available to assist in meeting health care needs will improve Outcome: Progressing   Problem: Physical Regulation: Goal: Complications related to the disease process, condition or treatment will be avoided or minimized Outcome: Progressing   Problem: Safety: Goal: Ability to remain free from injury will improve Outcome: Progressing   

## 2020-04-17 NOTE — Plan of Care (Signed)
  Problem: Education: Goal: Knowledge of [facility] Education information/materials will improve Outcome: Progressing Goal: Emotional status will improve Outcome: Progressing Goal: Mental status will improve Outcome: Progressing Goal: Verbalization of understanding the information provided will improve Outcome: Progressing   Problem: Activity: Goal: Interest or engagement in activities will improve Outcome: Progressing Goal: Sleeping patterns will improve Outcome: Progressing   Problem: Coping: Goal: Ability to verbalize frustrations and anger appropriately will improve Outcome: Progressing Goal: Ability to demonstrate self-control will improve Outcome: Progressing   Problem: Health Behavior/Discharge Planning: Goal: Identification of resources available to assist in meeting health care needs will improve Outcome: Progressing Goal: Compliance with treatment plan for underlying cause of condition will improve Outcome: Progressing   Problem: Physical Regulation: Goal: Ability to maintain clinical measurements within normal limits will improve Outcome: Progressing   Problem: Safety: Goal: Periods of time without injury will increase Outcome: Progressing   Problem: Education: Goal: Utilization of techniques to improve thought processes will improve Outcome: Progressing Goal: Knowledge of the prescribed therapeutic regimen will improve Outcome: Progressing   Problem: Activity: Goal: Interest or engagement in leisure activities will improve Outcome: Progressing Goal: Imbalance in normal sleep/wake cycle will improve Outcome: Progressing   Problem: Coping: Goal: Coping ability will improve Outcome: Progressing Goal: Will verbalize feelings Outcome: Progressing   Problem: Health Behavior/Discharge Planning: Goal: Ability to make decisions will improve Outcome: Progressing Goal: Compliance with therapeutic regimen will improve Outcome: Progressing    Problem: Role Relationship: Goal: Will demonstrate positive changes in social behaviors and relationships Outcome: Progressing   Problem: Safety: Goal: Ability to disclose and discuss suicidal ideas will improve Outcome: Progressing Goal: Ability to identify and utilize support systems that promote safety will improve Outcome: Progressing   Problem: Self-Concept: Goal: Will verbalize positive feelings about self Outcome: Progressing Goal: Level of anxiety will decrease Outcome: Progressing   Problem: Education: Goal: Knowledge of disease or condition will improve Outcome: Progressing Goal: Understanding of discharge needs will improve Outcome: Progressing   Problem: Health Behavior/Discharge Planning: Goal: Ability to identify changes in lifestyle to reduce recurrence of condition will improve Outcome: Progressing Goal: Identification of resources available to assist in meeting health care needs will improve Outcome: Progressing   Problem: Physical Regulation: Goal: Complications related to the disease process, condition or treatment will be avoided or minimized Outcome: Progressing   Problem: Safety: Goal: Ability to remain free from injury will improve Outcome: Progressing   

## 2020-04-17 NOTE — Progress Notes (Signed)
[facility] MD Progress Note  04/17/2020 12:49 PM Shane Petersen  MRN:  [id number] Subjective:   I'm doing fine, when can I go home?"  Pt seen for follow up. Chart reviewed. No overnight events. Pt calm, cooperative , pt  wanting to go home,  pt denies any other withdrawal symptoms, although he has some hand tremors, denies alcohol problem although admits drinking 5 beers a day, states he plans to cut it down, but do not want to quit.  Principal Problem: Severe recurrent major depression without psychotic features (HCC) Diagnosis: Principal Problem:   Severe recurrent major depression without psychotic features (HCC) Active Problems:   Insomnia   Paroxysmal atrial fibrillation (HCC)   Tobacco abuse, episodic   Alcohol withdrawal delirium, acute, hyperactive (HCC)   Alcohol abuse  Total Time spent with patient: 30 minutes  Past Psychiatric History: see H& P  Past Medical History:  Past Medical History:  Diagnosis Date  . Depression   . Hyperlipidemia   . Hypertension   . Paroxysmal atrial fibrillation (HCC)   . Ulcerative colitis (HCC)    History reviewed. No pertinent surgical history. Family History:  Family History  Problem Relation Age of Onset  . Cancer Neg Hx   . Heart disease Neg Hx    Family Psychiatric  History: see H & P Social History:  Social History   Substance and Sexual Activity  Alcohol Use Yes  . Alcohol/week: 21.0 standard drinks  . Types: 21 Standard drinks or equivalent per week     Social History   Substance and Sexual Activity  Drug Use No    Social History   Socioeconomic History  . Marital status: Divorced    Spouse name: Not on file  . Number of children: Not on file  . Years of education: Not on file  . Highest education level: Not on file  Occupational History  . Not on file  Tobacco Use  . Smoking status: Current Every Day Smoker    Types: Cigarettes  . Smokeless tobacco: Never Used  Substance and Sexual Activity  . Alcohol use:  Yes    Alcohol/week: 21.0 standard drinks    Types: 21 Standard drinks or equivalent per week  . Drug use: No  . Sexual activity: Not Currently    Partners: Female  Other Topics Concern  . Not on file  Social History Narrative  . Not on file   Social Determinants of Health   Financial Resource Strain:   . Difficulty of Paying Living Expenses: Not on file  Food Insecurity:   . Worried About Programme researcher, broadcasting/film/video in the Last Year: Not on file  . Ran Out of Food in the Last Year: Not on file  Transportation Needs:   . Lack of Transportation (Medical): Not on file  . Lack of Transportation (Non-Medical): Not on file  Physical Activity:   . Days of Exercise per Week: Not on file  . Minutes of Exercise per Session: Not on file  Stress:   . Feeling of Stress : Not on file  Social Connections:   . Frequency of Communication with Friends and Family: Not on file  . Frequency of Social Gatherings with Friends and Family: Not on file  . Attends Religious Services: Not on file  . Active Member of Clubs or Organizations: Not on file  . Attends Banker Meetings: Not on file  . Marital Status: Not on file   Additional Social History:  Sleep: Fair  Appetite:  Fair  Current Medications: Current Facility-Administered Medications  Medication Dose Route Frequency Provider Last Rate Last Admin  . acetaminophen (TYLENOL) tablet 650 mg  650 mg Oral Q6H PRN Clapacs, Cregg T, MD      . alum & mag hydroxide-simeth (MAALOX/MYLANTA) 200-200-20 MG/5ML suspension 30 mL  30 mL Oral Q4H PRN Clapacs, Hamsa T, MD      . atenolol (TENORMIN) tablet 75 mg  75 mg Oral QPM Jesse Sans, MD   75 mg at 04/16/20 1721  . LORazepam (ATIVAN) injection 0-4 mg  0-4 mg Intravenous Q12H Clapacs, Jackquline Denmark, MD       Or  . LORazepam (ATIVAN) tablet 0-4 mg  0-4 mg Oral Q12H Clapacs, Jackquline Denmark, MD   2 mg at 04/16/20 1954  . magnesium hydroxide (MILK OF MAGNESIA) suspension 30 mL   30 mL Oral Daily PRN Clapacs, Kadarious T, MD      . mirtazapine (REMERON) tablet 15 mg  15 mg Oral QHS Clapacs, Jackquline Denmark, MD   15 mg at 04/16/20 1955  . thiamine tablet 100 mg  100 mg Oral Daily Clapacs, Jackquline Denmark, MD   100 mg at 04/17/20 0843   Or  . thiamine (B-1) injection 100 mg  100 mg Intravenous Daily Clapacs, Orry T, MD      . traZODone (DESYREL) tablet 50 mg  50 mg Oral QHS Jesse Sans, MD   50 mg at 04/16/20 1955  . traZODone (DESYREL) tablet 50 mg  50 mg Oral QHS PRN Jesse Sans, MD   50 mg at 04/16/20 2047    Lab Results: No results found for this or any previous visit (from the past 48 hour(s)).  Blood Alcohol level:  Lab Results  Component Value Date   ETH <10 04/13/2020    Metabolic Disorder Labs: Lab Results  Component Value Date   HGBA1C 5.6 04/13/2020   MPG 114.02 04/13/2020   No results found for: PROLACTIN Lab Results  Component Value Date   CHOL 137 04/13/2020   TRIG 93 04/13/2020   HDL 45 04/13/2020   CHOLHDL 3.0 04/13/2020   VLDL 19 04/13/2020   LDLCALC 73 04/13/2020   LDLCALC 60 11/27/2011    Physical Findings: AIMS: Facial and Oral Movements Muscles of Facial Expression: None, normal Lips and Perioral Area: None, normal Jaw: None, normal Tongue: None, normal,Extremity Movements Upper (arms, wrists, hands, fingers): None, normal Lower (legs, knees, ankles, toes): None, normal, Trunk Movements Neck, shoulders, hips: None, normal, Overall Severity Severity of abnormal movements (highest score from questions above): None, normal Incapacitation due to abnormal movements: None, normal Patient's awareness of abnormal movements (rate only patient's report): No Awareness, Dental Status Current problems with teeth and/or dentures?: No Does patient usually wear dentures?: No  CIWA:  CIWA-Ar Total: 11 COWS:     Musculoskeletal: Strength & Muscle Tone: within normal limits Gait & Station: normal Patient leans:   Psychiatric Specialty  Exam: Physical Exam Constitutional:      Appearance: Normal appearance.  HENT:     Head: Normocephalic.     Mouth/Throat:     Mouth: Mucous membranes are moist.  Pulmonary:     Effort: Pulmonary effort is normal.  Musculoskeletal:        General: Normal range of motion.  Neurological:     General: No focal deficit present.     Mental Status: He is alert and oriented to person, place, and time.     Review  of Systems  Blood pressure 114/79, pulse 62, temperature 98.2 F (36.8 C), temperature source Oral, resp. rate 18, height 5' 8.9" (1.75 m), weight 67.6 kg, SpO2 100 %.Body mass index is 22.07 kg/m.  General Appearance: Disheveled  Eye Contact:  Fair  Speech:  Normal Rate  Volume:  Normal  Mood:  Anxious and Dysphoric  Affect:  Constricted  Thought Process:  Goal Directed  Orientation:  Full (Time, Place, and Person)  Thought Content:  Denies SI/HI, no evidence of psychosis  Suicidal Thoughts:  No  Homicidal Thoughts:  No  Memory: grossly Intact   Judgement:  Fair  Insight:  Shallow  Psychomotor Activity:  Normal  Concentration:  fair  Recall:  Good  Fund of Knowledge:  Good  Language:  Good  Akathisia:  Negative  Handed:    AIMS (if indicated):     Assets:    ADL's:  Intact  Cognition:  WNL  Sleep:  Number of Hours: 8.5     Treatment Plan Summary: Daily contact with patient to assess and evaluate symptoms and progress in treatment and Medication management .pt with Alcohol use disorder, pt minimizing problem, pt has some tremor.  Plan Plan of care: Continue inpatient admission. Discontinue Seroquel at this time for orthostatic hypotension.  Continue Mirtazapine 15 mg nightly for mood and insomnia.  Trazodone 50 mg QHS for insomnia.  Continue CIWA protocol for alcohol withdrawals. Cont thiamine supplement.   Atenolol moved to evening time per patient request. lives enzymes slightly elevated, will monitor.  Participation in Group/milieu tx.  I certify  that inpatient services furnished can reasonably be expected to improve the patient's condition.     Beverly Sessions, MD 04/17/2020, 12:49 PMPatient ID: Shane Petersen, male   DOB: 11-07-1950, 69 y.o.   MRN: [id number]

## 2020-04-17 NOTE — Progress Notes (Signed)
Patient continues to deny withdrawal symptoms initially but after getting his hs meds he asked for prn trazodone, saying "I'm not sleepy". He then continued to get anxious and somewhat irritable saying "I'm still not asleep". CIWA 11. Given 2 mg of Ativan.

## 2020-04-17 NOTE — Plan of Care (Signed)
D- Patient alert and oriented. Patient presents in a pleasant mood on assessment stating that he slept ok last night and had no complaints to voice to this Clinical research associate. Patient denies SI, HI, AVH, and pain at this time. Patient also denies any signs/symptoms of depression/anxiety. Patient's goal for today is "getting home, I'm not doing anything here, just laying around".  A- Scheduled medications administered to patient, per MD orders. Support and encouragement provided. Routine safety checks conducted every 15 minutes. Patient informed to notify staff with problems or concerns.  R- No adverse drug reactions noted. Patient contracts for safety at this time. Patient compliant with medications. Patient receptive, calm, and cooperative. Patient remains safe at this time.  Problem: Education: Goal: Knowledge of [place] [facility] Education information/materials will improve Outcome: Progressing Goal: Emotional status will improve Outcome: Progressing Goal: Mental status will improve Outcome: Progressing Goal: Verbalization of understanding the information provided will improve Outcome: Progressing   Problem: Activity: Goal: Interest or engagement in activities will improve Outcome: Progressing Goal: Sleeping patterns will improve Outcome: Progressing   Problem: Coping: Goal: Ability to verbalize frustrations and anger appropriately will improve Outcome: Progressing Goal: Ability to demonstrate self-control will improve Outcome: Progressing   Problem: Health Behavior/Discharge Planning: Goal: Identification of resources available to assist in meeting health care needs will improve Outcome: Progressing Goal: Compliance with treatment plan for underlying cause of condition will improve Outcome: Progressing   Problem: Physical Regulation: Goal: Ability to maintain clinical measurements within normal limits will improve Outcome: Progressing   Problem: Safety: Goal: Periods of time without  injury will increase Outcome: Progressing   Problem: Education: Goal: Utilization of techniques to improve thought processes will improve Outcome: Progressing Goal: Knowledge of the prescribed therapeutic regimen will improve Outcome: Progressing   Problem: Activity: Goal: Interest or engagement in leisure activities will improve Outcome: Progressing Goal: Imbalance in normal sleep/wake cycle will improve Outcome: Progressing   Problem: Coping: Goal: Coping ability will improve Outcome: Progressing Goal: Will verbalize feelings Outcome: Progressing   Problem: Health Behavior/Discharge Planning: Goal: Ability to make decisions will improve Outcome: Progressing Goal: Compliance with therapeutic regimen will improve Outcome: Progressing   Problem: Role Relationship: Goal: Will demonstrate positive changes in social behaviors and relationships Outcome: Progressing   Problem: Safety: Goal: Ability to disclose and discuss suicidal ideas will improve Outcome: Progressing Goal: Ability to identify and utilize support systems that promote safety will improve Outcome: Progressing   Problem: Self-Concept: Goal: Will verbalize positive feelings about self Outcome: Progressing Goal: Level of anxiety will decrease Outcome: Progressing   Problem: Education: Goal: Knowledge of disease or condition will improve Outcome: Progressing Goal: Understanding of discharge needs will improve Outcome: Progressing   Problem: Health Behavior/Discharge Planning: Goal: Ability to identify changes in lifestyle to reduce recurrence of condition will improve Outcome: Progressing Goal: Identification of resources available to assist in meeting health care needs will improve Outcome: Progressing   Problem: Physical Regulation: Goal: Complications related to the disease process, condition or treatment will be avoided or minimized Outcome: Progressing   Problem: Safety: Goal: Ability to remain  free from injury will improve Outcome: Progressing

## 2020-04-17 NOTE — [facility] Group Notes (Signed)
[facility] Group Notes: (Clinical Social Work)   04/17/2020      Type of Therapy:  Group Therapy   Participation Level:  Did Not Attend - was invited individually by Nurse/MHT and chose not to attend.   Susa Simmonds, LCSWA 04/17/2020  2:53 PM

## 2020-04-17 NOTE — Progress Notes (Signed)
Patient has had his regularly scheduled Trazodone 50 mg plus an additional Trazodone 50 mg prn and still complains of not being able to sleep. NP called and ordered an additional Trazodone 50 mg. Patient is still complaining of not sleeping but has been advised he will not receive any more medication. He is denying all withdrawal symptoms, SI, HI and AVH. Advised patient his Ativan has been discontinued.

## 2020-04-18 MED ORDER — MIRTAZAPINE 15 MG PO TABS: 15.0000 mg | ORAL_TABLET | Freq: Every day | ORAL | 1 refills | Status: AC

## 2020-04-18 MED ORDER — TRAZODONE HCL 100 MG PO TABS: 200.0000 mg | ORAL_TABLET | Freq: Every day | ORAL | 1 refills | Status: AC

## 2020-04-18 MED ORDER — ATENOLOL 25 MG PO TABS: 75.0000 mg | ORAL_TABLET | Freq: Every evening | ORAL | 1 refills | Status: AC

## 2020-04-18 MED ORDER — TRAZODONE HCL 100 MG PO TABS
200.0000 mg | ORAL_TABLET | Freq: Every day | ORAL | Status: DC
  Administered 2020-04-18: 200 mg via ORAL
  Filled 2020-04-18: qty 2

## 2020-04-18 MED ORDER — THIAMINE HCL 100 MG PO TABS: 100.0000 mg | ORAL_TABLET | Freq: Every day | ORAL | 1 refills | Status: AC

## 2020-04-18 NOTE — [facility] Counselor (Signed)
CSW met with pt regarding discharge. CSW inquires if pt would be interested in aftercare. Pt continues to deny interest in outpatient treatment. No other concerns expressed or assessed. Contact ends without incident.  Chalmers Guest. Guerry Bruin, MSW, LCSW, [place] 04/18/2020 2:28 PM

## 2020-04-18 NOTE — Plan of Care (Signed)
  Problem: Education: Goal: Knowledge of [place] [facility] Education information/materials will improve Outcome: Progressing Goal: Emotional status will improve Outcome: Progressing Goal: Mental status will improve Outcome: Progressing Goal: Verbalization of understanding the information provided will improve Outcome: Progressing   Problem: Activity: Goal: Interest or engagement in activities will improve Outcome: Progressing Goal: Sleeping patterns will improve Outcome: Progressing   Problem: Coping: Goal: Ability to verbalize frustrations and anger appropriately will improve Outcome: Progressing Goal: Ability to demonstrate self-control will improve Outcome: Progressing   Problem: Health Behavior/Discharge Planning: Goal: Identification of resources available to assist in meeting health care needs will improve Outcome: Progressing Goal: Compliance with treatment plan for underlying cause of condition will improve Outcome: Progressing   Problem: Physical Regulation: Goal: Ability to maintain clinical measurements within normal limits will improve Outcome: Progressing   Problem: Safety: Goal: Periods of time without injury will increase Outcome: Progressing   Problem: Education: Goal: Utilization of techniques to improve thought processes will improve Outcome: Progressing Goal: Knowledge of the prescribed therapeutic regimen will improve Outcome: Progressing   Problem: Activity: Goal: Interest or engagement in leisure activities will improve Outcome: Progressing Goal: Imbalance in normal sleep/wake cycle will improve Outcome: Progressing   Problem: Coping: Goal: Coping ability will improve Outcome: Progressing Goal: Will verbalize feelings Outcome: Progressing   Problem: Health Behavior/Discharge Planning: Goal: Ability to make decisions will improve Outcome: Progressing Goal: Compliance with therapeutic regimen will improve Outcome: Progressing    Problem: Role Relationship: Goal: Will demonstrate positive changes in social behaviors and relationships Outcome: Progressing   Problem: Safety: Goal: Ability to disclose and discuss suicidal ideas will improve Outcome: Progressing Goal: Ability to identify and utilize support systems that promote safety will improve Outcome: Progressing   Problem: Self-Concept: Goal: Will verbalize positive feelings about self Outcome: Progressing Goal: Level of anxiety will decrease Outcome: Progressing   Problem: Education: Goal: Knowledge of disease or condition will improve Outcome: Progressing Goal: Understanding of discharge needs will improve Outcome: Progressing   Problem: Health Behavior/Discharge Planning: Goal: Ability to identify changes in lifestyle to reduce recurrence of condition will improve Outcome: Progressing Goal: Identification of resources available to assist in meeting health care needs will improve Outcome: Progressing   Problem: Physical Regulation: Goal: Complications related to the disease process, condition or treatment will be avoided or minimized Outcome: Progressing   Problem: Safety: Goal: Ability to remain free from injury will improve Outcome: Progressing   

## 2020-04-18 NOTE — Plan of Care (Signed)
D- Patient alert and oriented. Patient presents in a pleasant mood on assessment stating that it took him a while to fall asleep last night, but did not have any complaints to voice to this Clinical research associate. Patient denies SI, HI, AVH, and pain at this time. Patient also denies any signs/symptoms of depression/anxiety, reporting "I'm fine". Patient's goal for today is to "discharge".  A- Scheduled medications administered to patient, per MD orders. Support and encouragement provided.  Routine safety checks conducted every 15 minutes.  Patient informed to notify staff with problems or concerns.  R- No adverse drug reactions noted. Patient contracts for safety at this time. Patient compliant with medications and treatment plan. Patient receptive, calm, and cooperative. Patient remains safe at this time.  Problem: Education: Goal: Knowledge of [place] [facility] Education information/materials will improve Outcome: Progressing Goal: Emotional status will improve Outcome: Progressing Goal: Mental status will improve Outcome: Progressing Goal: Verbalization of understanding the information provided will improve Outcome: Progressing   Problem: Activity: Goal: Interest or engagement in activities will improve Outcome: Progressing Goal: Sleeping patterns will improve Outcome: Progressing   Problem: Coping: Goal: Ability to verbalize frustrations and anger appropriately will improve Outcome: Progressing Goal: Ability to demonstrate self-control will improve Outcome: Progressing   Problem: Health Behavior/Discharge Planning: Goal: Identification of resources available to assist in meeting health care needs will improve Outcome: Progressing Goal: Compliance with treatment plan for underlying cause of condition will improve Outcome: Progressing   Problem: Physical Regulation: Goal: Ability to maintain clinical measurements within normal limits will improve Outcome: Progressing   Problem:  Safety: Goal: Periods of time without injury will increase Outcome: Progressing   Problem: Education: Goal: Utilization of techniques to improve thought processes will improve Outcome: Progressing Goal: Knowledge of the prescribed therapeutic regimen will improve Outcome: Progressing   Problem: Activity: Goal: Interest or engagement in leisure activities will improve Outcome: Progressing Goal: Imbalance in normal sleep/wake cycle will improve Outcome: Progressing   Problem: Coping: Goal: Coping ability will improve Outcome: Progressing Goal: Will verbalize feelings Outcome: Progressing   Problem: Health Behavior/Discharge Planning: Goal: Ability to make decisions will improve Outcome: Progressing Goal: Compliance with therapeutic regimen will improve Outcome: Progressing   Problem: Role Relationship: Goal: Will demonstrate positive changes in social behaviors and relationships Outcome: Progressing   Problem: Safety: Goal: Ability to disclose and discuss suicidal ideas will improve Outcome: Progressing Goal: Ability to identify and utilize support systems that promote safety will improve Outcome: Progressing   Problem: Self-Concept: Goal: Will verbalize positive feelings about self Outcome: Progressing Goal: Level of anxiety will decrease Outcome: Progressing   Problem: Education: Goal: Knowledge of disease or condition will improve Outcome: Progressing Goal: Understanding of discharge needs will improve Outcome: Progressing   Problem: Health Behavior/Discharge Planning: Goal: Ability to identify changes in lifestyle to reduce recurrence of condition will improve Outcome: Progressing Goal: Identification of resources available to assist in meeting health care needs will improve Outcome: Progressing   Problem: Physical Regulation: Goal: Complications related to the disease process, condition or treatment will be avoided or minimized Outcome: Progressing    Problem: Safety: Goal: Ability to remain free from injury will improve Outcome: Progressing

## 2020-04-18 NOTE — Evaluation (Signed)
Physical Therapy Evaluation Patient Details Name: Shane Petersen MRN: [id number] DOB: [birth year] Today's Date: 04/18/2020   History of Present Illness  Bennet Kujawa is a 69yoM who comes to [facility] after fall at home. Per son, pt has been drinking 15-20 beers daily, unkempt, house littered with trash and feces. Pt apparently sustaining falls regularly.  Clinical Impression  Pt admitted with above diagnosis. Pt currently with functional limitations due to the deficits listed below (see "PT Problem List"). Upon entry, pt in bed, awake and agreeable to participate. The pt is alert and oriented x4, pleasant, provides history when asked. Independent bed mobility, transfers, and AMB, none indicative of falls. Extensive balance screening demonstrates only mild increased risk of falls, however pt reports most of his issues are related to ETOH use/polypharmacy. Pt denies transient dizziness, but admits to sustained dizziness when drinking alcohol. Pt denies any history of syncope or presyncope. Pt has orthostatic drop from sitting to standing ~49mmHg SBP that is sustained even after AMB around the unit- pt remains asymptomatic. Pt also has documented orthostatic drop 5 days prior with nursing. Patient is currently at baseline, all education completed, and time is given to address all questions/concerns. No additional skilled PT services needed at this time, PT signing off. PT recommends daily ambulation ad lib or with nursing staff as needed to prevent deconditioning.      Follow Up Recommendations No PT follow up    Equipment Recommendations  None recommended by PT    Recommendations for Other Services       Precautions / Restrictions Precautions Precautions: None Restrictions Weight Bearing Restrictions: No      Mobility  Bed Mobility Overal bed mobility: Independent                  Transfers Overall transfer level: Independent Equipment used: None             General  transfer comment: 5xSTS: 15.6sec  Ambulation/Gait   Gait Distance (Feet): 350 Feet Assistive device: None Gait Pattern/deviations: WFL(Within Functional Limits) Gait velocity: 0.92m/s   General Gait Details: minimal trunk sway/rotation; no balance issues  Stairs            Wheelchair Mobility    Modified Rankin (Stroke Patients Only)       Balance                                 Standardized Balance Assessment Standardized Balance Assessment : Berg Balance Test Berg Balance Test Sit to Stand: Able to stand without using hands and stabilize independently Standing Unsupported: Able to stand safely 2 minutes Sitting with Back Unsupported but Feet Supported on Floor or Stool: Able to sit safely and securely 2 minutes Stand to Sit: Sits safely with minimal use of hands Transfers: Able to transfer safely, minor use of hands Standing Unsupported with Eyes Closed: Able to stand 10 seconds safely Standing Ubsupported with Feet Together: Able to place feet together independently and stand 1 minute safely From Standing, Reach Forward with Outstretched Arm: Can reach confidently >25 cm (10") From Standing Position, Pick up Object from Floor: Able to pick up shoe safely and easily From Standing Position, Turn to Look Behind Over each Shoulder: Turn sideways only but maintains balance Turn 360 Degrees: Able to turn 360 degrees safely in 4 seconds or less Standing Unsupported, Alternately Place Feet on Step/Stool: Able to stand independently and safely and complete 8 steps  in 20 seconds Standing Unsupported, One Foot in Front: Able to take small step independently and hold 30 seconds Standing on One Leg: Tries to lift leg/unable to hold 3 seconds but remains standing independently Total Score: 49         Pertinent Vitals/Pain Pain Assessment: No/denies pain    Home Living Family/patient expects to be discharged to:: Private residence Living Arrangements:  Alone Available Help at Discharge: Family Type of Home: House Home Access: Stairs to enter Entrance Stairs-Rails: None Secretary/administrator of Steps: 2 Home Layout: One level Home Equipment: None      Prior Function Level of Independence: Independent         Comments: still drives, groceries, meals; frequent falls pt attributes to sel-imposed polypharmacy (seroquel + ETOH)     Hand Dominance        Extremity/Trunk Assessment   Upper Extremity Assessment Upper Extremity Assessment: Overall WFL for tasks assessed    Lower Extremity Assessment Lower Extremity Assessment: Overall WFL for tasks assessed       Communication      Cognition Arousal/Alertness: Awake/alert Behavior During Therapy: WFL for tasks assessed/performed Overall Cognitive Status: Within Functional Limits for tasks assessed                                        General Comments      Exercises     Assessment/Plan    PT Assessment Patent does not need any further PT services  PT Problem List         PT Treatment Interventions      PT Goals (Current goals can be found in the Care Plan section)  Acute Rehab PT Goals PT Goal Formulation: All assessment and education complete, DC therapy    Frequency     Barriers to discharge        Co-evaluation               AM-PAC PT "6 Clicks" Mobility  Outcome Measure Help needed turning from your back to your side while in a flat bed without using bedrails?: None Help needed moving from lying on your back to sitting on the side of a flat bed without using bedrails?: None Help needed moving to and from a bed to a chair (including a wheelchair)?: None Help needed standing up from a chair using your arms (e.g., wheelchair or bedside chair)?: None Help needed to walk in hospital room?: None Help needed climbing 3-5 steps with a railing? : None 6 Click Score: 24    End of Session Equipment Utilized During Treatment:  Gait belt Activity Tolerance: Patient tolerated treatment well;No increased pain Patient left: in bed   PT Visit Diagnosis: History of falling (Z91.81)    Time: 3295-1884 PT Time Calculation (min) (ACUTE ONLY): 16 min   Charges:   PT Evaluation $PT Eval Moderate Complexity: 1 Mod          1:57 PM, 04/18/20 Rosamaria Lints, PT, DPT Physical Therapist - [facility]  [phone] (ASCOM)    Alexandra Posadas C 04/18/2020, 1:53 PM

## 2020-04-18 NOTE — Progress Notes (Signed)
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 He notes that prior to that he would work all day, and often into the night. He also had his wife and children around when he was home to keep him distracted. Now that he is on his own he has begun to fill the day with alcohol to pass the time. Current routine is to walk in the morning, have a drink at 10AM, eat lunch, then drink 2 more beers, then to read in the afternoon with 2 more drinks, and then have two more beers in the evening. He states while in the hospital he has not had any alcohol cravings, and declines FDA approved medications for alcohol cravings such as Naltrexone. He states he will simply not drink at discharge, and instead walk, read, and watch TV more. He also plans to move closer to his children. He continues to decline voluntary substance abuse treatment, or AA meetings at this time. However, he does see Dr. Raj Janus every 2 weeks, and plans to continue to see him for psychiatric care. He denies any alcohol withdrawal symptoms today, and has no tremors on exam. He denies suicidal ideations, homicidal ideations, visual hallucinations, and auditory hallucinations.   Principal Problem: Severe recurrent major depression without psychotic features (HCC) Diagnosis: Principal Problem:   Severe recurrent major depression without psychotic features (HCC) Active Problems:   Insomnia   Paroxysmal atrial fibrillation (HCC)   Tobacco abuse, episodic   Alcohol withdrawal delirium, acute, hyperactive (HCC)   Alcohol abuse  Total Time spent with patient: 30 minutes  Past Psychiatric History: see H& P  Past Medical History:  Past Medical History:  Diagnosis Date  . Depression   . Hyperlipidemia   . Hypertension   . Paroxysmal atrial fibrillation (HCC)   . Ulcerative colitis  (HCC)    History reviewed. No pertinent surgical history. Family History:  Family History  Problem Relation Age of Onset  . Cancer Neg Hx   . Heart disease Neg Hx    Family Psychiatric  History: see H & P Social History:  Social History   Substance and Sexual Activity  Alcohol Use Yes  . Alcohol/week: 21.0 standard drinks  . Types: 21 Standard drinks or equivalent per week     Social History   Substance and Sexual Activity  Drug Use No    Social History   Socioeconomic History  . Marital status: Divorced    Spouse name: Not on file  . Number of children: Not on file  . Years of education: Not on file  . Highest education level: Not on file  Occupational History  . Not on file  Tobacco Use  . Smoking status: Current Every Day Smoker    Types: Cigarettes  . Smokeless tobacco: Never Used  Substance and Sexual Activity  . Alcohol use: Yes    Alcohol/week: 21.0 standard drinks    Types: 21 Standard drinks or equivalent per week  . Drug use: No  . Sexual activity: Not Currently    Partners: Female  Other Topics Concern  . Not on file  Social History Narrative  . Not on file   Social Determinants of Health   Financial Resource Strain:   . Difficulty of Paying Living Expenses: Not on file  Food Insecurity:   . Worried About Running  Out of Food in the Last Year: Not on file  . Ran Out of Food in the Last Year: Not on file  Transportation Needs:   . Lack of Transportation (Medical): Not on file  . Lack of Transportation (Non-Medical): Not on file  Physical Activity:   . Days of Exercise per Week: Not on file  . Minutes of Exercise per Session: Not on file  Stress:   . Feeling of Stress : Not on file  Social Connections:   . Frequency of Communication with Friends and Family: Not on file  . Frequency of Social Gatherings with Friends and Family: Not on file  . Attends Religious Services: Not on file  . Active Member of Clubs or Organizations: Not on file  .  Attends Banker Meetings: Not on file  . Marital Status: Not on file   Additional Social History:                         Sleep: Fair  Appetite:  Fair  Current Medications: Current Facility-Administered Medications  Medication Dose Route Frequency Provider Last Rate Last Admin  . acetaminophen (TYLENOL) tablet 650 mg  650 mg Oral Q6H PRN Clapacs, Gerrard T, MD      . alum & mag hydroxide-simeth (MAALOX/MYLANTA) 200-200-20 MG/5ML suspension 30 mL  30 mL Oral Q4H PRN Clapacs, Enzio T, MD      . atenolol (TENORMIN) tablet 75 mg  75 mg Oral QPM Jesse Sans, MD   75 mg at 04/17/20 1723  . magnesium hydroxide (MILK OF MAGNESIA) suspension 30 mL  30 mL Oral Daily PRN Clapacs, Irma T, MD      . mirtazapine (REMERON) tablet 15 mg  15 mg Oral QHS Clapacs, Jackquline Denmark, MD   15 mg at 04/17/20 2054  . thiamine tablet 100 mg  100 mg Oral Daily Clapacs, Traylon T, MD   100 mg at 04/18/20 3762   Or  . thiamine (B-1) injection 100 mg  100 mg Intravenous Daily Clapacs, Dailey T, MD      . traZODone (DESYREL) tablet 200 mg  200 mg Oral QHS Jesse Sans, MD        Lab Results: No results found for this or any previous visit (from the past 48 hour(s)).  Blood Alcohol level:  Lab Results  Component Value Date   ETH <10 04/13/2020    Metabolic Disorder Labs: Lab Results  Component Value Date   HGBA1C 5.6 04/13/2020   MPG 114.02 04/13/2020   No results found for: PROLACTIN Lab Results  Component Value Date   CHOL 137 04/13/2020   TRIG 93 04/13/2020   HDL 45 04/13/2020   CHOLHDL 3.0 04/13/2020   VLDL 19 04/13/2020   LDLCALC 73 04/13/2020   LDLCALC 60 11/27/2011    Physical Findings: AIMS: Facial and Oral Movements Muscles of Facial Expression: None, normal Lips and Perioral Area: None, normal Jaw: None, normal Tongue: None, normal,Extremity Movements Upper (arms, wrists, hands, fingers): None, normal Lower (legs, knees, ankles, toes): None, normal, Trunk  Movements Neck, shoulders, hips: None, normal, Overall Severity Severity of abnormal movements (highest score from questions above): None, normal Incapacitation due to abnormal movements: None, normal Patient's awareness of abnormal movements (rate only patient's report): No Awareness, Dental Status Current problems with teeth and/or dentures?: No Does patient usually wear dentures?: No  CIWA:  CIWA-Ar Total: 11 COWS:     Musculoskeletal: Strength & Muscle Tone: within normal  limits Gait & Station: normal Patient leans:   Psychiatric Specialty Exam: Physical Exam Constitutional:      Appearance: Normal appearance.  HENT:     Head: Normocephalic.     Mouth/Throat:     Mouth: Mucous membranes are moist.  Pulmonary:     Effort: Pulmonary effort is normal.  Musculoskeletal:        General: Normal range of motion.  Neurological:     General: No focal deficit present.     Mental Status: He is alert and oriented to person, place, and time.     Review of Systems   Blood pressure 132/79, pulse (!) 59, temperature 98.8 F (37.1 C), temperature source Oral, resp. rate 16, height 5' 8.9" (1.75 m), weight 67.6 kg, SpO2 99 %.Body mass index is 22.07 kg/m.  General Appearance: Disheveled  Eye Contact:  Fair  Speech:  Normal Rate  Volume:  Normal  Mood:  Dysphoric  Affect:  Constricted  Thought Process:  Goal Directed  Orientation:  Full (Time, Place, and Person)  Thought Content:  Denies SI/HI, no evidence of psychosis  Suicidal Thoughts:  No  Homicidal Thoughts:  No  Memory: grossly Intact   Judgement:  Fair  Insight:  Shallow  Psychomotor Activity:  Normal  Concentration:  fair  Recall:  Good  Fund of Knowledge:  Good  Language:  Good  Akathisia:  Negative  Handed:    AIMS (if indicated):     Assets:    ADL's:  Intact  Cognition:  WNL  Sleep:  Number of Hours: 7.75     Treatment Plan Summary: Daily contact with patient to assess and evaluate symptoms and progress  in treatment and Medication management .pt with Alcohol use disorder, pt minimizing problem, pt has some tremor.  Plan Plan of care: Continue inpatient admission.Seroquel discontinued on admission for orthostatic hypotension.  Continue Mirtazapine 15 mg nightly for mood and insomnia.  Trazodone 200 mg QHS for insomnia.  Discontinue CIWA protocol at this time as he is outside the window for acute withdrawals. Cont thiamine supplement.   Atenolol moved to evening time per patient request. Liver enzymes slightly elevated, will monitor.  Participation in Group/milieu tx.  I certify that inpatient services furnished can reasonably be expected to improve the patient's condition.     Jesse Sans, MD 04/18/2020, 11:31 AMPatient ID: Shane Petersen, male   DOB: 07/05/1950, 69 y.o.   MRN: [id number]

## 2020-04-18 NOTE — Discharge Summary (Signed)
Physician Discharge Summary Note  Patient:  Shane Petersen is an 69 y.o., male MRN:  [id number] DOB:  09/05/1950 Patient phone:  [phone] (home)  Patient address:   [address] [place] [id number],  Total Time spent with patient: 30 minutes  Date of Admission:  04/14/2020 Date of Discharge: 04/19/2020  Reason for Admission:  Involuntary commitment for depression and worsening alcohol abuse causing multiple falls at home  Principal Problem: Severe recurrent major depression without psychotic features [facility](HCC) Discharge Diagnoses: Principal Problem:   Severe recurrent major depression without psychotic features (HCC) Active Problems:   Insomnia   Paroxysmal atrial fibrillation (HCC)   Tobacco abuse, episodic   Alcohol abuse   Past Psychiatric History: No previous inpatient hospitalizations, no previous suicide attempts. No history of substance abuse treatment. No history of seizures or delirium tremens. See Dr. Raj JanusIrons for outpatient psychiatry for several years. Previous medications include Remeron, Seroquel, Abilify, and Lunesta.   Past Medical History:  Past Medical History:  Diagnosis Date  . Depression   . Hyperlipidemia   . Hypertension   . Paroxysmal atrial fibrillation (HCC)   . Ulcerative colitis (HCC)    History reviewed. No pertinent surgical history. Family History:  Family History  Problem Relation Age of Onset  . Cancer Neg Hx   . Heart disease Neg Hx    Family Psychiatric  History: None reported Social History:  Social History   Substance and Sexual Activity  Alcohol Use Yes  . Alcohol/week: 21.0 standard drinks  . Types: 21 Standard drinks or equivalent per week     Social History   Substance and Sexual Activity  Drug Use No    Social History   Socioeconomic History  . Marital status: Divorced    Spouse name: Not on file  . Number of children: Not on file  . Years of education: Not on file  . Highest education level: Not on file    Occupational History  . Not on file  Tobacco Use  . Smoking status: Current Every Day Smoker    Types: Cigarettes  . Smokeless tobacco: Never Used  Substance and Sexual Activity  . Alcohol use: Yes    Alcohol/week: 21.0 standard drinks    Types: 21 Standard drinks or equivalent per week  . Drug use: No  . Sexual activity: Not Currently    Partners: Female  Other Topics Concern  . Not on file  Social History Narrative  . Not on file   Social Determinants of Health   Financial Resource Strain:   . Difficulty of Paying Living Expenses: Not on file  Food Insecurity:   . Worried About Programme researcher, broadcasting/film/videounning Out of Food in the Last Year: Not on file  . Ran Out of Food in the Last Year: Not on file  Transportation Needs:   . Lack of Transportation (Medical): Not on file  . Lack of Transportation (Non-Medical): Not on file  Physical Activity:   . Days of Exercise per Week: Not on file  . Minutes of Exercise per Session: Not on file  Stress:   . Feeling of Stress : Not on file  Social Connections:   . Frequency of Communication with Friends and Family: Not on file  . Frequency of Social Gatherings with Friends and Family: Not on file  . Attends Religious Services: Not on file  . Active Member of Clubs or Organizations: Not on file  . Attends BankerClub or Organization Meetings: Not on file  . Marital Status: Not  on file    Hospital Course:  Patient admitted for depression and alcohol use disorder, severe that was causing disruptions with family and falls at home. During entire admission patient continued to state he only drinks 5 beers per day due to boredom since retirement and divorce. Initially, patient blamed his falls at home on Seroquel and orthostatic hypotension. However, prior to discharge he did admit that the alcohol was likely causing his falls at home. He was offered Naltrexone to help with alcohol cravings, but he declined medications to assist with alcohol use disorder. He also declined  AA meetings, or substance abuse treatment programs. He did agree to stopping Seroquel as this can contribute to falls. He was continued on Mirtazapine, and trazodone was given for sleep and titrated to 200 mg daily. Montreal Cognitive Assessment administered to screen for dementia and patient scored 24/30 (see below). He was also evaluated by physical therapy given numerous falls prior to admission. On physical therapy assessment he was well balanced, and no PT follow-up or equipment was recommended. Patient completed acute alcohol detox without incident. Highest CIWA score was 11 for which he received Ativan 2 mg orally. At time of discharge CIWA score zero. He denied suicidal ideations, homicidal ideations, visual hallucinations, and auditory hallucinations. He planned to stop drinking, return to [place] with his children, and follow-up with Dr. Raj Janus for outpatient psychiatric care. Throughout stay patient agreed to provider communicating with family and primary care physician. Family and PCP informed of above changes to medication, hospital care, and plan for discharge. Children in agreement, and plan to pick up father from the hospital and have him stay with them in [place] through Thanksgiving. At time of discharge patient, family, and treatment team felt he was safe to discharge from the hospital with continued outpatient care.      Physical Findings: AIMS: Facial and Oral Movements Muscles of Facial Expression: None, normal Lips and Perioral Area: None, normal Jaw: None, normal Tongue: None, normal,Extremity Movements Upper (arms, wrists, hands, fingers): None, normal Lower (legs, knees, ankles, toes): None, normal, Trunk Movements Neck, shoulders, hips: None, normal, Overall Severity Severity of abnormal movements (highest score from questions above): None, normal Incapacitation due to abnormal movements: None, normal Patient's awareness of abnormal movements (rate only  patient's report): No Awareness, Dental Status Current problems with teeth and/or dentures?: No Does patient usually wear dentures?: No  CIWA:  CIWA-Ar Total: 11 COWS:     Musculoskeletal: Strength & Muscle Tone: within normal limits Gait & Station: normal Patient leans: N/A  Psychiatric Specialty Exam: Physical Exam Vitals and nursing note reviewed.  Constitutional:      General: He is not in acute distress. HENT:     Head: Normocephalic.     Comments: Scabbed abrasion to upper right eyebrow    Right Ear: External ear normal.     Left Ear: External ear normal.     Nose: Nose normal.     Mouth/Throat:     Mouth: Mucous membranes are moist.     Pharynx: Oropharynx is clear.  Eyes:     Extraocular Movements: Extraocular movements intact.     Conjunctiva/sclera: Conjunctivae normal.     Pupils: Pupils are equal, round, and reactive to light.  Cardiovascular:     Rate and Rhythm: Normal rate.     Pulses: Normal pulses.  Pulmonary:     Effort: Pulmonary effort is normal.     Breath sounds: Normal breath sounds.  Abdominal:  General: Abdomen is flat.     Palpations: Abdomen is soft.  Musculoskeletal:        General: No swelling. Normal range of motion.     Cervical back: Normal range of motion and neck supple.  Skin:    General: Skin is warm and dry.  Neurological:     General: No focal deficit present.     Mental Status: He is alert and oriented to person, place, and time.  Psychiatric:        Mood and Affect: Mood normal.        Behavior: Behavior normal.        Thought Content: Thought content normal.        Judgment: Judgment normal.     Review of Systems  Constitutional: Negative for activity change, chills, diaphoresis and fatigue.  Respiratory: Negative for cough and shortness of breath.   Cardiovascular: Negative for chest pain and palpitations.  Gastrointestinal: Negative for constipation, diarrhea, nausea and vomiting.  Neurological: Negative for  dizziness, light-headedness and headaches.  Psychiatric/Behavioral: Negative for dysphoric mood, hallucinations and suicidal ideas. The patient is not nervous/anxious.   All other systems reviewed and are negative.   Blood pressure (!) 156/88, pulse (!) 57, temperature 98.2 F (36.8 C), temperature source Oral, resp. rate 16, height 5' 8.9" (1.75 m), weight 67.6 kg, SpO2 100 %.Body mass index is 22.07 kg/m.  General Appearance: Fairly Groomed  Patent attorney::  Fair  Speech:  Clear and Coherent  Volume:  Normal  Mood:  Euthymic  Affect:  Congruent  Thought Process:  Coherent and Linear  Orientation:  Full (Time, Place, and Person)  Thought Content:  Logical  Suicidal Thoughts:  No  Homicidal Thoughts:  No  Memory:  Immediate;   Good Recent;   Good Remote;   Good  Judgement:  Intact  Insight:  Present  Psychomotor Activity:  Normal  Concentration:  Fair  Recall:  Good  Fund of Knowledge:Good  Language: Good  Akathisia:  Negative  Handed:  Right  AIMS (if indicated):     Assets:  Communication Skills Desire for Improvement Financial Resources/Insurance Housing Resilience Social Support Talents/Skills Transportation Vocational/Educational  Sleep:  Number of Hours: 7.5  Cognition: WNL  ADL's:  Intact           Has this patient used any form of tobacco in the last 30 days? (Cigarettes, Smokeless Tobacco, Cigars, and/or Pipes)  Yes, A prescription for an FDA-approved tobacco cessation medication was offered at discharge and the patient refused  Blood Alcohol level:  Lab Results  Component Value Date   [facility] <10 04/13/2020    Metabolic Disorder Labs:  Lab Results  Component Value Date   HGBA1C 5.6 04/13/2020   MPG 114.02 04/13/2020   No results found for: PROLACTIN Lab Results  Component Value Date   CHOL 137 04/13/2020   TRIG 93 04/13/2020   HDL 45 04/13/2020   CHOLHDL 3.0 04/13/2020   VLDL 19 04/13/2020   LDLCALC 73 04/13/2020   LDLCALC 60 11/27/2011     See Psychiatric Specialty Exam and Suicide Risk Assessment completed by Attending Physician prior to discharge.  Discharge destination:  Home  Is patient on multiple antipsychotic therapies at discharge:  No   Has Patient had three or more failed trials of antipsychotic monotherapy by history:  No  Recommended Plan for Multiple Antipsychotic Therapies: NA  Discharge Instructions    Diet - low sodium heart healthy   Complete by: As directed  Increase activity slowly   Complete by: As directed      Allergies as of 04/19/2020   No Known Allergies     Medication List    STOP taking these medications   ARIPiprazole 2 MG tablet Commonly known as: ABILIFY   gabapentin 100 MG capsule Commonly known as: NEURONTIN   QUEtiapine 25 MG tablet Commonly known as: SEROQUEL     TAKE these medications     Indication  aspirin EC 81 MG tablet Take 81 mg by mouth daily. Swallow whole.  Indication: Disease involving Lipid Deposits in the Arteries   atenolol 25 MG tablet Commonly known as: TENORMIN TAKE 3 TABLETS (75 MG TOTAL)  DAILY. What changed: See the new instructions.  Indication: High Blood Pressure Disorder   atenolol 25 MG tablet Commonly known as: TENORMIN Take 3 tablets (75 mg total) by mouth every evening. What changed: You were already taking a medication with the same name, and this prescription was added. Make sure you understand how and when to take each.  Indication: High Blood Pressure Disorder   atorvastatin 20 MG tablet Commonly known as: LIPITOR TAKE 1 TABLET EVERY DAY  Indication: High Amount of Fats in the Blood   mirtazapine 15 MG tablet Commonly known as: REMERON Take 1 tablet (15 mg total) by mouth daily.  Indication: Major Depressive Disorder   multivitamin tablet Take 1 tablet by mouth daily.  Indication: Nutritional Support, Vitamin Deficiency   thiamine 100 MG tablet Take 1 tablet (100 mg total) by mouth daily.  Indication: Deficiency  of Vitamin B1   traZODone 100 MG tablet Commonly known as: DESYREL Take 2 tablets (200 mg total) by mouth at bedtime.  Indication: Trouble Sleeping       Follow-up Information    Patient declined Follow up.   Why: Patient declined              Follow-up recommendations:   Activity:  as tolerated Diet:  low-sodium, heart healthy diet Tests:  recommend repeat CMP for elevated transaminases on admission  Comments: Printed 30-day prescriptions with one refill for patient prior to discharge.   Signed: Jesse Sans, MD 04/19/2020, 9:08 AM

## 2020-04-18 NOTE — BHH Suicide Risk Assessment (Signed)
[facility] Discharge Suicide Risk Assessment   Principal Problem: Severe recurrent major depression without psychotic features [facility]) Discharge Diagnoses: Principal Problem:   Severe recurrent major depression without psychotic features (HCC) Active Problems:   Insomnia   Paroxysmal atrial fibrillation (HCC)   Tobacco abuse, episodic   Alcohol abuse   Total Time spent with patient: 30 minutes  Musculoskeletal: Strength & Muscle Tone: within normal limits Gait & Station: normal Patient leans: N/A  Psychiatric Specialty Exam: Review of Systems  Blood pressure (!) 156/88, pulse (!) 57, temperature 98.2 F (36.8 C), temperature source Oral, resp. rate 16, height 5' 8.9" (1.75 m), weight 67.6 kg, SpO2 100 %.Body mass index is 22.07 kg/m.  General Appearance: Fairly Groomed  Patent attorney::  Fair  Speech:  Clear and Coherent409  Volume:  Normal  Mood:  Euthymic  Affect:  Congruent  Thought Process:  Coherent and Linear  Orientation:  Full (Time, Place, and Person)  Thought Content:  Logical  Suicidal Thoughts:  No  Homicidal Thoughts:  No  Memory:  Immediate;   Good Recent;   Good Remote;   Good  Judgement:  Intact  Insight:  Present  Psychomotor Activity:  Normal  Concentration:  Fair  Recall:  Good  Fund of Knowledge:Good  Language: Good  Akathisia:  Negative  Handed:  Right  AIMS (if indicated):     Assets:  Communication Skills Desire for Improvement Financial Resources/Insurance Housing Resilience Social Support Talents/Skills Transportation Vocational/Educational  Sleep:  Number of Hours: 7.5  Cognition: WNL  ADL's:  Intact   Mental Status Per Nursing Assessment::   On Admission:  NA  Demographic Factors:  Male, Age 7 or older, Divorced or widowed and Caucasian  Loss Factors: NA  Historical Factors: NA  Risk Reduction Factors:   Sense of responsibility to family, Religious beliefs about death, Positive social support, Positive therapeutic relationship  and Positive coping skills or problem solving skills  Continued Clinical Symptoms:  Depression:   Insomnia Alcohol/Substance Abuse/Dependencies  Cognitive Features That Contribute To Risk:  None    Suicide Risk:  Mild:  Suicidal ideation of limited frequency, intensity, duration, and specificity.  There are no identifiable plans, no associated intent, mild dysphoria and related symptoms, good self-control (both objective and subjective assessment), few other risk factors, and identifiable protective factors, including available and accessible social support.   Follow-up Information    Patient declined Follow up.   Why: Patient declined              Plan Of Care/Follow-up recommendations:  Activity:  as tolerated Diet:  low-sodium, heart healthy diet Tests:  recommend repeat CMP for elevated transaminases on admission  Jesse Sans, MD 04/19/2020, 9:08 AM

## 2020-04-18 NOTE — BHH Group Notes (Signed)
LCSW Group Therapy Note   04/18/2020 2:10 PM  Type of Therapy and Topic:  Group Therapy:  Overcoming Obstacles   Participation Level:  Did Not Attend   Description of Group:    In this group patients will be encouraged to explore what they see as obstacles to their own wellness and recovery. They will be guided to discuss their thoughts, feelings, and behaviors related to these obstacles. The group will process together ways to cope with barriers, with attention given to specific choices patients can make. Each patient will be challenged to identify changes they are motivated to make in order to overcome their obstacles. This group will be process-oriented, with patients participating in exploration of their own experiences as well as giving and receiving support and challenge from other group members.   Therapeutic Goals: 1. Patient will identify personal and current obstacles as they relate to admission. 2. Patient will identify barriers that currently interfere with their wellness or overcoming obstacles.  3. Patient will identify feelings, thought process and behaviors related to these barriers. 4. Patient will identify two changes they are willing to make to overcome these obstacles:      Summary of Patient Progress X    Therapeutic Modalities:   Cognitive Behavioral Therapy Solution Focused Therapy Motivational Interviewing Relapse Prevention Therapy  Linley Moxley R. Vannesa Abair, MSW, LCSW, LCAS 04/18/2020 2:10 PM    

## 2020-04-18 NOTE — Progress Notes (Signed)
Patient alert and oriented. He is presents to the medication room in agitated state. He has been asking for medication for the past 2 hrs wanting meds to get to sleep and has concerns that he will not be able to sleep tonight. He denies SI HI AVH  anxiety, depression and pain at this encounter. He received his medication and tolerated without incident. He is safe on the unit with 15 minute safety and was encouraged to contat staff with any concerns.      Cleo Butler-Nicholson, LPN

## 2020-04-19 NOTE — Progress Notes (Signed)
Recreation Therapy Notes   Date: 04/19/2020  Time: 9:30 am   Location: Craft room     Behavioral response: N/A   Intervention Topic: Relaxation   Discussion/Intervention: Patient did not attend group.   Clinical Observations/Feedback:  Patient did not attend group.   Dayn Barich LRT/CTRS        Drezden Seitzinger 04/19/2020 11:07 AM

## 2020-04-19 NOTE — Plan of Care (Signed)
Problem: Group Participation Goal: STG - Patient will engage in groups without prompting or encouragement from LRT x3 group sessions within 5 recreation therapy group sessions Description: STG - Patient will engage in groups without prompting or encouragement from LRT x3 group sessions within 5 recreation therapy group sessions 04/19/2020 1132 by Ernest Haber, LRT Outcome: Not Applicable 15/[id number]/[id number] by Ernest Haber, LRT Outcome: Not Met (add Reason) Note: Patient did not attend any groups.

## 2020-04-19 NOTE — Progress Notes (Signed)
D: Pt alert and oriented. Pt denies experiencing any pain, SI/HI, or AVH at this time. Pt reports he will be able to keep himself safe when he returns home.   A: Pt received discharge and medication education/information. Pt belongings were returned and signed for at this time, to include printed prescriptions.   R: Pt verbalized understanding of discharge and medication education/information.  Pt  Escorted by staff to medical mall front lobby where pt's family picked pt up.

## 2020-04-19 NOTE — Progress Notes (Signed)
  [facility] Adult Case Management Discharge Plan :  Will you be returning to the same living situation after discharge:  Yes,  pt plans to return home. At discharge, do you have transportation home?: Yes,  pt children to provide transport home.  Do you have the ability to pay for your medications: Yes,  [employer] Medicare.  Release of information consent forms completed and in the chart;  Patient's signature needed at discharge.  Patient to Follow up at:  Follow-up Information    Patient declined Follow up.   Why: Patient declined              Next level of care provider has access to [facility] Link:no  Safety Planning and Suicide Prevention discussed: Yes,  SPE completed with son, Dontrell Stuck.     Has patient been referred to the Quitline?: Patient refused referral  Patient has been referred for addiction treatment: Pt. refused referral  Glenis Smoker, LCSW 04/19/2020, 9:07 AM

## 2020-04-19 NOTE — Progress Notes (Signed)
D: Pt alert and oriented. Pt rates depression 0/10 and anxiety 0/10. Pt denies experiencing any pain at this time. Pt denies experiencing any SI/HI, or AVH at this time.   A: Scheduled medications administered to pt, per MD orders. Support and encouragement provided. Frequent verbal contact made. Routine safety checks conducted q15 minutes.   R: No adverse drug reactions noted. Pt verbally contracts for safety at this time. Pt complaint with medications. Pt interacts well with others on the unit. Pt remains safe at this time. Will continue to monitor.

## 2020-04-19 NOTE — Progress Notes (Signed)
Recreation Therapy Notes  INPATIENT RECREATION TR PLAN  Patient Details Name: TORRIN CRIHFIELD MRN: [id number] DOB: 11/17/1950 Today's Date: 04/19/2020  Rec Therapy Plan Is patient appropriate for Therapeutic Recreation?: Yes Treatment times per week: at least 3 Estimated Length of Stay: 5-7 days TR Treatment/Interventions: Group participation (Comment)  Discharge Criteria Pt will be discharged from therapy if:: Discharged Treatment plan/goals/alternatives discussed and agreed upon by:: Patient/family  Discharge Summary Short term goals set: Patient will engage in groups without prompting or encouragement from LRT x3 group sessions within 5 recreation therapy group sessions Short term goals met: Not met Reason goals not met: Patient did not attend any group Therapeutic equipment acquired: N/A Reason patient discharged from therapy: Discharge from hospital Pt/family agrees with progress & goals achieved: Yes Date patient discharged from therapy: 04/19/20   Kyley Solow 04/19/2020, 11:33 AM

## 2020-06-02 ENCOUNTER — Other Ambulatory Visit: Payer: Self-pay | Admitting: Internal Medicine

## 2020-09-01 DIAGNOSIS — W19XXXD Unspecified fall, subsequent encounter: Secondary | ICD-10-CM | POA: Diagnosis not present

## 2020-09-01 DIAGNOSIS — S0240CA Maxillary fracture, right side, initial encounter for closed fracture: Secondary | ICD-10-CM | POA: Diagnosis not present

## 2020-09-01 DIAGNOSIS — F1020 Alcohol dependence, uncomplicated: Secondary | ICD-10-CM | POA: Diagnosis not present

## 2020-09-01 DIAGNOSIS — I10 Essential (primary) hypertension: Secondary | ICD-10-CM | POA: Diagnosis not present

## 2020-09-01 DIAGNOSIS — G9340 Encephalopathy, unspecified: Secondary | ICD-10-CM | POA: Diagnosis not present

## 2020-09-01 DIAGNOSIS — E871 Hypo-osmolality and hyponatremia: Secondary | ICD-10-CM | POA: Diagnosis not present

## 2020-09-01 DIAGNOSIS — S02401A Maxillary fracture, unspecified, initial encounter for closed fracture: Secondary | ICD-10-CM | POA: Diagnosis not present

## 2020-09-01 DIAGNOSIS — G9608 Other cranial cerebrospinal fluid leak: Secondary | ICD-10-CM | POA: Diagnosis not present

## 2020-09-01 DIAGNOSIS — Z66 Do not resuscitate: Secondary | ICD-10-CM | POA: Diagnosis not present

## 2020-09-01 DIAGNOSIS — M542 Cervicalgia: Secondary | ICD-10-CM | POA: Diagnosis not present

## 2020-09-01 DIAGNOSIS — S02609A Fracture of mandible, unspecified, initial encounter for closed fracture: Secondary | ICD-10-CM | POA: Diagnosis not present

## 2020-09-01 DIAGNOSIS — R630 Anorexia: Secondary | ICD-10-CM | POA: Diagnosis not present

## 2020-09-01 DIAGNOSIS — R9431 Abnormal electrocardiogram [ECG] [EKG]: Secondary | ICD-10-CM | POA: Diagnosis not present

## 2020-09-01 DIAGNOSIS — R7401 Elevation of levels of liver transaminase levels: Secondary | ICD-10-CM | POA: Diagnosis not present

## 2020-09-01 DIAGNOSIS — E440 Moderate protein-calorie malnutrition: Secondary | ICD-10-CM | POA: Diagnosis not present

## 2020-09-01 DIAGNOSIS — S065X0D Traumatic subdural hemorrhage without loss of consciousness, subsequent encounter: Secondary | ICD-10-CM | POA: Diagnosis not present

## 2020-09-01 DIAGNOSIS — S065X9A Traumatic subdural hemorrhage with loss of consciousness of unspecified duration, initial encounter: Secondary | ICD-10-CM | POA: Diagnosis not present

## 2020-09-01 DIAGNOSIS — W19XXXA Unspecified fall, initial encounter: Secondary | ICD-10-CM | POA: Diagnosis not present

## 2020-09-01 DIAGNOSIS — I4892 Unspecified atrial flutter: Secondary | ICD-10-CM | POA: Diagnosis not present

## 2020-09-01 DIAGNOSIS — F339 Major depressive disorder, recurrent, unspecified: Secondary | ICD-10-CM | POA: Diagnosis not present

## 2020-09-01 DIAGNOSIS — F332 Major depressive disorder, recurrent severe without psychotic features: Secondary | ICD-10-CM | POA: Diagnosis not present

## 2020-09-01 DIAGNOSIS — F1010 Alcohol abuse, uncomplicated: Secondary | ICD-10-CM | POA: Diagnosis not present

## 2020-09-01 DIAGNOSIS — R404 Transient alteration of awareness: Secondary | ICD-10-CM | POA: Diagnosis not present

## 2020-09-01 DIAGNOSIS — R296 Repeated falls: Secondary | ICD-10-CM | POA: Diagnosis not present

## 2020-09-01 DIAGNOSIS — G9349 Other encephalopathy: Secondary | ICD-10-CM | POA: Diagnosis not present

## 2020-09-01 DIAGNOSIS — S0003XA Contusion of scalp, initial encounter: Secondary | ICD-10-CM | POA: Diagnosis not present

## 2020-09-01 DIAGNOSIS — X58XXXA Exposure to other specified factors, initial encounter: Secondary | ICD-10-CM | POA: Diagnosis not present

## 2020-09-01 DIAGNOSIS — S0281XA Fracture of other specified skull and facial bones, right side, initial encounter for closed fracture: Secondary | ICD-10-CM | POA: Diagnosis not present

## 2020-09-01 DIAGNOSIS — R000 Tachycardia, unspecified: Secondary | ICD-10-CM | POA: Diagnosis not present

## 2020-09-01 DIAGNOSIS — E512 Wernicke's encephalopathy: Secondary | ICD-10-CM | POA: Diagnosis not present

## 2020-09-01 DIAGNOSIS — Z681 Body mass index (BMI) 19 or less, adult: Secondary | ICD-10-CM | POA: Diagnosis not present

## 2020-09-01 DIAGNOSIS — S065X0A Traumatic subdural hemorrhage without loss of consciousness, initial encounter: Secondary | ICD-10-CM | POA: Diagnosis not present

## 2020-09-01 DIAGNOSIS — R4182 Altered mental status, unspecified: Secondary | ICD-10-CM | POA: Diagnosis not present

## 2020-09-01 DIAGNOSIS — G9341 Metabolic encephalopathy: Secondary | ICD-10-CM | POA: Diagnosis not present

## 2020-09-01 DIAGNOSIS — F32A Depression, unspecified: Secondary | ICD-10-CM | POA: Diagnosis not present

## 2020-09-02 DIAGNOSIS — E871 Hypo-osmolality and hyponatremia: Secondary | ICD-10-CM | POA: Diagnosis not present

## 2020-09-02 DIAGNOSIS — X58XXXA Exposure to other specified factors, initial encounter: Secondary | ICD-10-CM | POA: Diagnosis not present

## 2020-09-02 DIAGNOSIS — S065X0D Traumatic subdural hemorrhage without loss of consciousness, subsequent encounter: Secondary | ICD-10-CM | POA: Diagnosis not present

## 2020-09-02 DIAGNOSIS — W19XXXA Unspecified fall, initial encounter: Secondary | ICD-10-CM | POA: Diagnosis not present

## 2020-09-02 DIAGNOSIS — G9608 Other cranial cerebrospinal fluid leak: Secondary | ICD-10-CM | POA: Diagnosis not present

## 2020-09-02 DIAGNOSIS — G9340 Encephalopathy, unspecified: Secondary | ICD-10-CM | POA: Diagnosis not present

## 2020-09-02 DIAGNOSIS — F1010 Alcohol abuse, uncomplicated: Secondary | ICD-10-CM | POA: Diagnosis not present

## 2020-09-02 DIAGNOSIS — S065X9A Traumatic subdural hemorrhage with loss of consciousness of unspecified duration, initial encounter: Secondary | ICD-10-CM | POA: Diagnosis not present

## 2020-09-02 DIAGNOSIS — S02401A Maxillary fracture, unspecified, initial encounter for closed fracture: Secondary | ICD-10-CM | POA: Diagnosis not present

## 2020-09-03 DIAGNOSIS — S065X9A Traumatic subdural hemorrhage with loss of consciousness of unspecified duration, initial encounter: Secondary | ICD-10-CM | POA: Diagnosis not present

## 2020-09-03 DIAGNOSIS — R9431 Abnormal electrocardiogram [ECG] [EKG]: Secondary | ICD-10-CM | POA: Diagnosis not present

## 2020-09-03 DIAGNOSIS — E871 Hypo-osmolality and hyponatremia: Secondary | ICD-10-CM | POA: Diagnosis not present

## 2020-09-03 DIAGNOSIS — G9340 Encephalopathy, unspecified: Secondary | ICD-10-CM | POA: Diagnosis not present

## 2020-09-03 DIAGNOSIS — W19XXXA Unspecified fall, initial encounter: Secondary | ICD-10-CM | POA: Diagnosis not present

## 2020-09-03 DIAGNOSIS — S02401A Maxillary fracture, unspecified, initial encounter for closed fracture: Secondary | ICD-10-CM | POA: Diagnosis not present

## 2020-09-04 DIAGNOSIS — E871 Hypo-osmolality and hyponatremia: Secondary | ICD-10-CM | POA: Diagnosis not present

## 2020-09-04 DIAGNOSIS — S065X9A Traumatic subdural hemorrhage with loss of consciousness of unspecified duration, initial encounter: Secondary | ICD-10-CM | POA: Diagnosis not present

## 2020-09-04 DIAGNOSIS — R9431 Abnormal electrocardiogram [ECG] [EKG]: Secondary | ICD-10-CM | POA: Diagnosis not present

## 2020-09-04 DIAGNOSIS — S02401A Maxillary fracture, unspecified, initial encounter for closed fracture: Secondary | ICD-10-CM | POA: Diagnosis not present

## 2020-09-04 DIAGNOSIS — W19XXXA Unspecified fall, initial encounter: Secondary | ICD-10-CM | POA: Diagnosis not present

## 2020-09-04 DIAGNOSIS — G9340 Encephalopathy, unspecified: Secondary | ICD-10-CM | POA: Diagnosis not present

## 2020-09-05 DIAGNOSIS — R000 Tachycardia, unspecified: Secondary | ICD-10-CM | POA: Diagnosis not present

## 2020-09-05 DIAGNOSIS — S02401A Maxillary fracture, unspecified, initial encounter for closed fracture: Secondary | ICD-10-CM | POA: Diagnosis not present

## 2020-09-05 DIAGNOSIS — E871 Hypo-osmolality and hyponatremia: Secondary | ICD-10-CM | POA: Diagnosis not present

## 2020-09-05 DIAGNOSIS — G9340 Encephalopathy, unspecified: Secondary | ICD-10-CM | POA: Diagnosis not present

## 2020-09-05 DIAGNOSIS — W19XXXA Unspecified fall, initial encounter: Secondary | ICD-10-CM | POA: Diagnosis not present

## 2020-09-05 DIAGNOSIS — S065X9A Traumatic subdural hemorrhage with loss of consciousness of unspecified duration, initial encounter: Secondary | ICD-10-CM | POA: Diagnosis not present

## 2020-09-05 DIAGNOSIS — R9431 Abnormal electrocardiogram [ECG] [EKG]: Secondary | ICD-10-CM | POA: Diagnosis not present

## 2020-09-06 DIAGNOSIS — S02401A Maxillary fracture, unspecified, initial encounter for closed fracture: Secondary | ICD-10-CM | POA: Diagnosis not present

## 2020-09-06 DIAGNOSIS — S065X9A Traumatic subdural hemorrhage with loss of consciousness of unspecified duration, initial encounter: Secondary | ICD-10-CM | POA: Diagnosis not present

## 2020-09-06 DIAGNOSIS — G9340 Encephalopathy, unspecified: Secondary | ICD-10-CM | POA: Diagnosis not present

## 2020-09-06 DIAGNOSIS — W19XXXA Unspecified fall, initial encounter: Secondary | ICD-10-CM | POA: Diagnosis not present

## 2020-09-06 DIAGNOSIS — E871 Hypo-osmolality and hyponatremia: Secondary | ICD-10-CM | POA: Diagnosis not present

## 2020-09-07 DIAGNOSIS — S02401A Maxillary fracture, unspecified, initial encounter for closed fracture: Secondary | ICD-10-CM | POA: Diagnosis not present

## 2020-09-07 DIAGNOSIS — S065X9A Traumatic subdural hemorrhage with loss of consciousness of unspecified duration, initial encounter: Secondary | ICD-10-CM | POA: Diagnosis not present

## 2020-09-07 DIAGNOSIS — W19XXXA Unspecified fall, initial encounter: Secondary | ICD-10-CM | POA: Diagnosis not present

## 2020-09-07 DIAGNOSIS — E871 Hypo-osmolality and hyponatremia: Secondary | ICD-10-CM | POA: Diagnosis not present

## 2020-09-07 DIAGNOSIS — G9340 Encephalopathy, unspecified: Secondary | ICD-10-CM | POA: Diagnosis not present

## 2020-09-08 DIAGNOSIS — S02401A Maxillary fracture, unspecified, initial encounter for closed fracture: Secondary | ICD-10-CM | POA: Diagnosis not present

## 2020-09-08 DIAGNOSIS — S065X9A Traumatic subdural hemorrhage with loss of consciousness of unspecified duration, initial encounter: Secondary | ICD-10-CM | POA: Diagnosis not present

## 2020-09-08 DIAGNOSIS — W19XXXA Unspecified fall, initial encounter: Secondary | ICD-10-CM | POA: Diagnosis not present

## 2020-09-08 DIAGNOSIS — E871 Hypo-osmolality and hyponatremia: Secondary | ICD-10-CM | POA: Diagnosis not present

## 2020-09-08 DIAGNOSIS — G9340 Encephalopathy, unspecified: Secondary | ICD-10-CM | POA: Diagnosis not present

## 2020-09-09 DIAGNOSIS — G9340 Encephalopathy, unspecified: Secondary | ICD-10-CM | POA: Diagnosis not present

## 2020-09-09 DIAGNOSIS — W19XXXA Unspecified fall, initial encounter: Secondary | ICD-10-CM | POA: Diagnosis not present

## 2020-09-09 DIAGNOSIS — S065X9A Traumatic subdural hemorrhage with loss of consciousness of unspecified duration, initial encounter: Secondary | ICD-10-CM | POA: Diagnosis not present

## 2020-09-09 DIAGNOSIS — E871 Hypo-osmolality and hyponatremia: Secondary | ICD-10-CM | POA: Diagnosis not present

## 2020-09-09 DIAGNOSIS — S02401A Maxillary fracture, unspecified, initial encounter for closed fracture: Secondary | ICD-10-CM | POA: Diagnosis not present

## 2020-09-13 DIAGNOSIS — S065X9A Traumatic subdural hemorrhage with loss of consciousness of unspecified duration, initial encounter: Secondary | ICD-10-CM | POA: Diagnosis not present

## 2020-09-13 DIAGNOSIS — I10 Essential (primary) hypertension: Secondary | ICD-10-CM | POA: Diagnosis not present

## 2020-09-13 DIAGNOSIS — G9340 Encephalopathy, unspecified: Secondary | ICD-10-CM | POA: Diagnosis not present

## 2020-09-13 DIAGNOSIS — E871 Hypo-osmolality and hyponatremia: Secondary | ICD-10-CM | POA: Diagnosis not present

## 2020-09-14 DIAGNOSIS — E871 Hypo-osmolality and hyponatremia: Secondary | ICD-10-CM | POA: Diagnosis not present

## 2020-09-14 DIAGNOSIS — S065X9A Traumatic subdural hemorrhage with loss of consciousness of unspecified duration, initial encounter: Secondary | ICD-10-CM | POA: Diagnosis not present

## 2020-09-14 DIAGNOSIS — I10 Essential (primary) hypertension: Secondary | ICD-10-CM | POA: Diagnosis not present

## 2020-09-14 DIAGNOSIS — G9340 Encephalopathy, unspecified: Secondary | ICD-10-CM | POA: Diagnosis not present

## 2020-09-15 DIAGNOSIS — G9340 Encephalopathy, unspecified: Secondary | ICD-10-CM | POA: Diagnosis not present

## 2020-09-15 DIAGNOSIS — I10 Essential (primary) hypertension: Secondary | ICD-10-CM | POA: Diagnosis not present

## 2020-09-15 DIAGNOSIS — F1020 Alcohol dependence, uncomplicated: Secondary | ICD-10-CM | POA: Diagnosis not present

## 2020-09-15 DIAGNOSIS — E871 Hypo-osmolality and hyponatremia: Secondary | ICD-10-CM | POA: Diagnosis not present

## 2020-09-15 DIAGNOSIS — S065X9A Traumatic subdural hemorrhage with loss of consciousness of unspecified duration, initial encounter: Secondary | ICD-10-CM | POA: Diagnosis not present

## 2020-09-15 DIAGNOSIS — F339 Major depressive disorder, recurrent, unspecified: Secondary | ICD-10-CM | POA: Diagnosis not present

## 2020-09-16 DIAGNOSIS — S065X9A Traumatic subdural hemorrhage with loss of consciousness of unspecified duration, initial encounter: Secondary | ICD-10-CM | POA: Diagnosis not present

## 2020-09-16 DIAGNOSIS — I10 Essential (primary) hypertension: Secondary | ICD-10-CM | POA: Diagnosis not present

## 2020-09-16 DIAGNOSIS — G9340 Encephalopathy, unspecified: Secondary | ICD-10-CM | POA: Diagnosis not present

## 2020-09-16 DIAGNOSIS — F1020 Alcohol dependence, uncomplicated: Secondary | ICD-10-CM | POA: Diagnosis not present

## 2020-09-16 DIAGNOSIS — E871 Hypo-osmolality and hyponatremia: Secondary | ICD-10-CM | POA: Diagnosis not present

## 2020-09-16 DIAGNOSIS — F339 Major depressive disorder, recurrent, unspecified: Secondary | ICD-10-CM | POA: Diagnosis not present

## 2020-09-17 DIAGNOSIS — R7401 Elevation of levels of liver transaminase levels: Secondary | ICD-10-CM | POA: Diagnosis not present

## 2020-09-17 DIAGNOSIS — G9340 Encephalopathy, unspecified: Secondary | ICD-10-CM | POA: Diagnosis not present

## 2020-09-17 DIAGNOSIS — R630 Anorexia: Secondary | ICD-10-CM | POA: Diagnosis not present

## 2020-09-17 DIAGNOSIS — I10 Essential (primary) hypertension: Secondary | ICD-10-CM | POA: Diagnosis not present

## 2020-09-17 DIAGNOSIS — W19XXXA Unspecified fall, initial encounter: Secondary | ICD-10-CM | POA: Diagnosis not present

## 2020-09-17 DIAGNOSIS — S065X9A Traumatic subdural hemorrhage with loss of consciousness of unspecified duration, initial encounter: Secondary | ICD-10-CM | POA: Diagnosis not present

## 2020-09-17 DIAGNOSIS — F32A Depression, unspecified: Secondary | ICD-10-CM | POA: Diagnosis not present

## 2020-09-18 DIAGNOSIS — F32A Depression, unspecified: Secondary | ICD-10-CM | POA: Diagnosis not present

## 2020-09-18 DIAGNOSIS — I10 Essential (primary) hypertension: Secondary | ICD-10-CM | POA: Diagnosis not present

## 2020-09-18 DIAGNOSIS — G9340 Encephalopathy, unspecified: Secondary | ICD-10-CM | POA: Diagnosis not present

## 2020-09-18 DIAGNOSIS — W19XXXA Unspecified fall, initial encounter: Secondary | ICD-10-CM | POA: Diagnosis not present

## 2020-09-18 DIAGNOSIS — R7401 Elevation of levels of liver transaminase levels: Secondary | ICD-10-CM | POA: Diagnosis not present

## 2020-09-18 DIAGNOSIS — R630 Anorexia: Secondary | ICD-10-CM | POA: Diagnosis not present

## 2020-09-18 DIAGNOSIS — S065X9A Traumatic subdural hemorrhage with loss of consciousness of unspecified duration, initial encounter: Secondary | ICD-10-CM | POA: Diagnosis not present

## 2020-09-19 DIAGNOSIS — S065X9A Traumatic subdural hemorrhage with loss of consciousness of unspecified duration, initial encounter: Secondary | ICD-10-CM | POA: Diagnosis not present

## 2020-09-19 DIAGNOSIS — R7401 Elevation of levels of liver transaminase levels: Secondary | ICD-10-CM | POA: Diagnosis not present

## 2020-09-19 DIAGNOSIS — G9340 Encephalopathy, unspecified: Secondary | ICD-10-CM | POA: Diagnosis not present

## 2020-09-19 DIAGNOSIS — R630 Anorexia: Secondary | ICD-10-CM | POA: Diagnosis not present

## 2020-09-19 DIAGNOSIS — W19XXXA Unspecified fall, initial encounter: Secondary | ICD-10-CM | POA: Diagnosis not present

## 2020-09-19 DIAGNOSIS — F1010 Alcohol abuse, uncomplicated: Secondary | ICD-10-CM | POA: Diagnosis not present

## 2020-09-19 DIAGNOSIS — F32A Depression, unspecified: Secondary | ICD-10-CM | POA: Diagnosis not present

## 2020-09-19 DIAGNOSIS — F332 Major depressive disorder, recurrent severe without psychotic features: Secondary | ICD-10-CM | POA: Diagnosis not present

## 2020-09-19 DIAGNOSIS — I10 Essential (primary) hypertension: Secondary | ICD-10-CM | POA: Diagnosis not present

## 2020-09-20 DIAGNOSIS — F32A Depression, unspecified: Secondary | ICD-10-CM | POA: Diagnosis not present

## 2020-09-20 DIAGNOSIS — R7401 Elevation of levels of liver transaminase levels: Secondary | ICD-10-CM | POA: Diagnosis not present

## 2020-09-20 DIAGNOSIS — S065X9A Traumatic subdural hemorrhage with loss of consciousness of unspecified duration, initial encounter: Secondary | ICD-10-CM | POA: Diagnosis not present

## 2020-09-20 DIAGNOSIS — W19XXXA Unspecified fall, initial encounter: Secondary | ICD-10-CM | POA: Diagnosis not present

## 2020-09-20 DIAGNOSIS — G9340 Encephalopathy, unspecified: Secondary | ICD-10-CM | POA: Diagnosis not present

## 2020-09-20 DIAGNOSIS — I10 Essential (primary) hypertension: Secondary | ICD-10-CM | POA: Diagnosis not present

## 2020-09-20 DIAGNOSIS — R630 Anorexia: Secondary | ICD-10-CM | POA: Diagnosis not present

## 2020-09-21 DIAGNOSIS — R630 Anorexia: Secondary | ICD-10-CM | POA: Diagnosis not present

## 2020-09-21 DIAGNOSIS — R7401 Elevation of levels of liver transaminase levels: Secondary | ICD-10-CM | POA: Diagnosis not present

## 2020-09-21 DIAGNOSIS — F32A Depression, unspecified: Secondary | ICD-10-CM | POA: Diagnosis not present

## 2020-09-21 DIAGNOSIS — G9340 Encephalopathy, unspecified: Secondary | ICD-10-CM | POA: Diagnosis not present

## 2020-09-21 DIAGNOSIS — W19XXXA Unspecified fall, initial encounter: Secondary | ICD-10-CM | POA: Diagnosis not present

## 2020-09-21 DIAGNOSIS — S065X9A Traumatic subdural hemorrhage with loss of consciousness of unspecified duration, initial encounter: Secondary | ICD-10-CM | POA: Diagnosis not present

## 2020-09-21 DIAGNOSIS — I10 Essential (primary) hypertension: Secondary | ICD-10-CM | POA: Diagnosis not present

## 2020-09-22 DIAGNOSIS — R7401 Elevation of levels of liver transaminase levels: Secondary | ICD-10-CM | POA: Diagnosis not present

## 2020-09-22 DIAGNOSIS — S065X9A Traumatic subdural hemorrhage with loss of consciousness of unspecified duration, initial encounter: Secondary | ICD-10-CM | POA: Diagnosis not present

## 2020-09-22 DIAGNOSIS — I10 Essential (primary) hypertension: Secondary | ICD-10-CM | POA: Diagnosis not present

## 2020-09-22 DIAGNOSIS — G9340 Encephalopathy, unspecified: Secondary | ICD-10-CM | POA: Diagnosis not present

## 2020-09-22 DIAGNOSIS — F32A Depression, unspecified: Secondary | ICD-10-CM | POA: Diagnosis not present

## 2020-09-22 DIAGNOSIS — R630 Anorexia: Secondary | ICD-10-CM | POA: Diagnosis not present

## 2020-09-22 DIAGNOSIS — W19XXXA Unspecified fall, initial encounter: Secondary | ICD-10-CM | POA: Diagnosis not present

## 2020-10-09 DEATH — deceased

## 2021-06-24 IMAGING — CT CT HEAD W/O CM
4 series · 16 of 47 positions shown, 18 images · non-contrast
Comparison: None.

CLINICAL DATA: Recent fall

EXAM:
CT HEAD WITHOUT CONTRAST
TECHNIQUE: Contiguous axial images were obtained from the base of the skull
through the vertex without intravenous contrast.

[Series 2: head wo · axial · 0.43mm/px · z∈[-180,-80]mm · 6 of 29 slices shown, 8 images]
[im 5/29  brain]
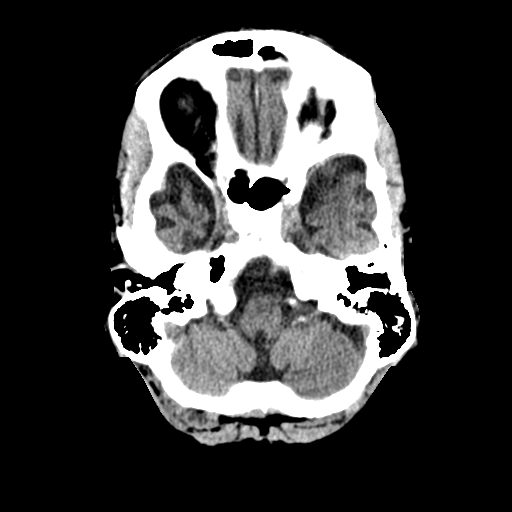
[im 5/29  bone]
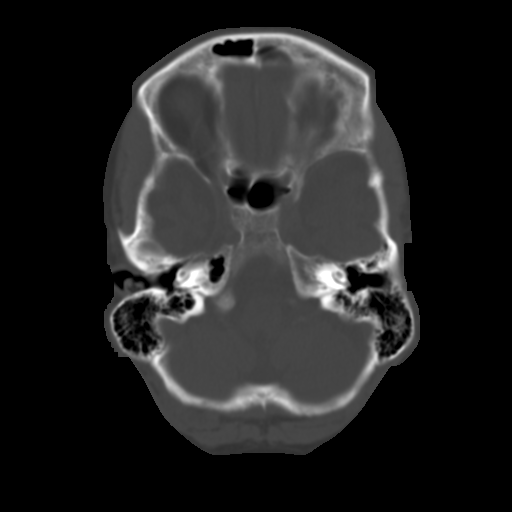
[im 9/29  brain]
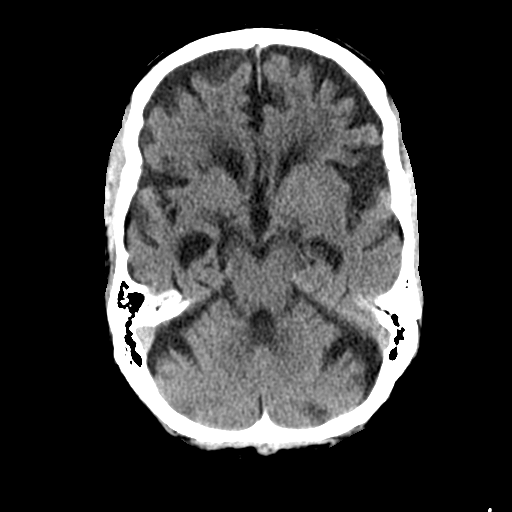
[im 13/29  brain]
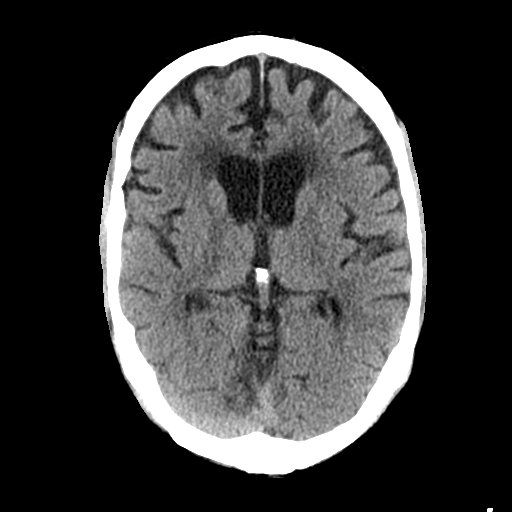
[im 17/29  brain]
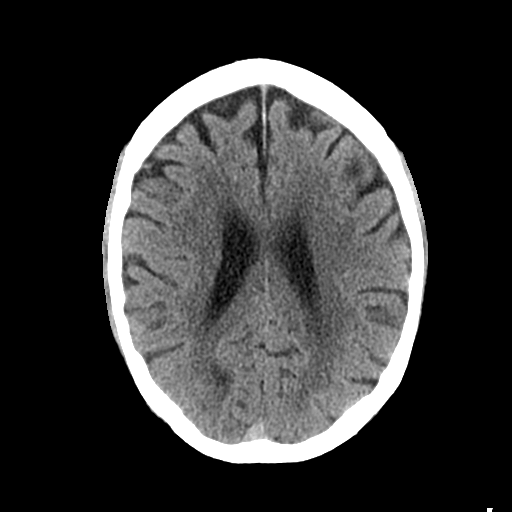
[im 21/29  brain]
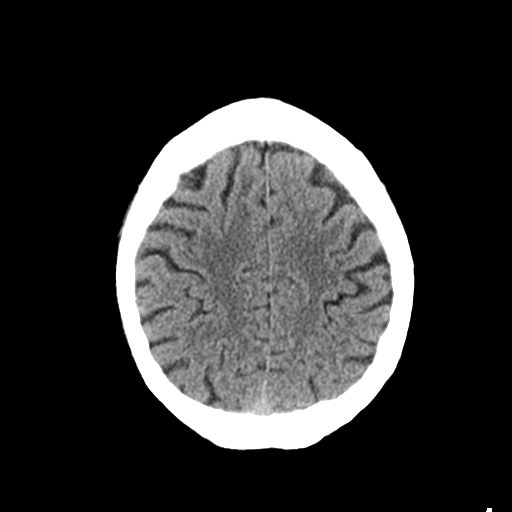
[im 21/29  bone]
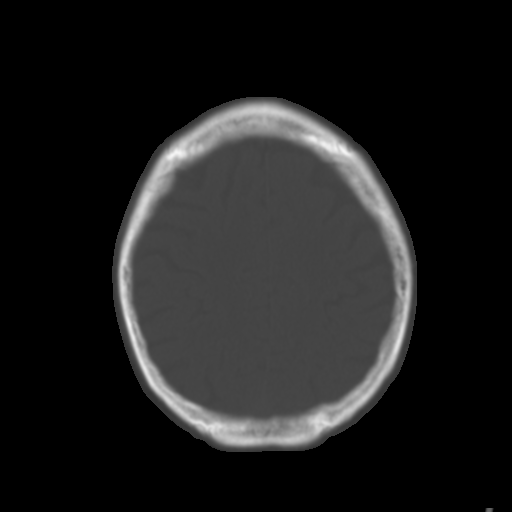
[im 25/29  brain]
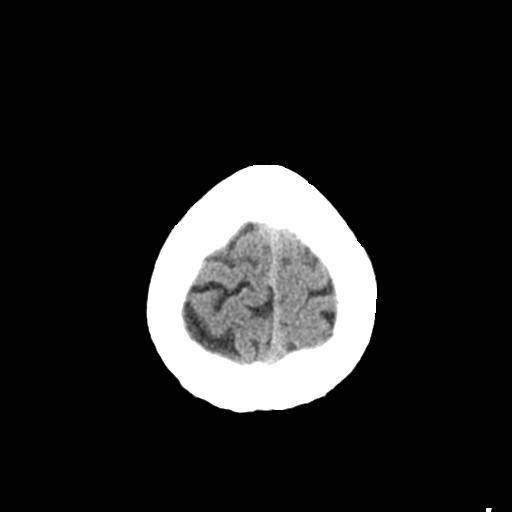

[Series 3: head bone · axial · 0.43mm/px · z∈[-188,-138]mm · 4 of 74 slices shown]
[im 7/74  bone]
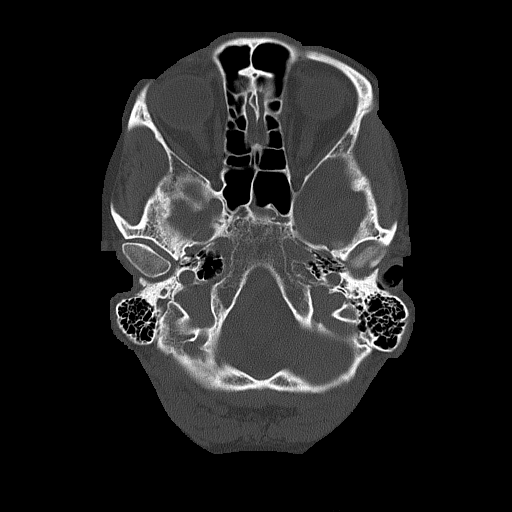
[im 14/74  bone]
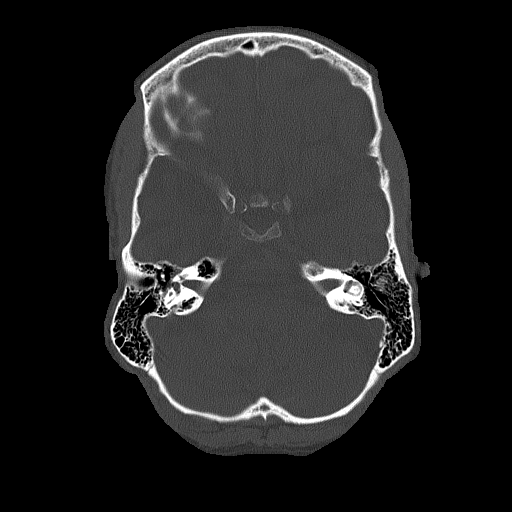
[im 25/74  bone]
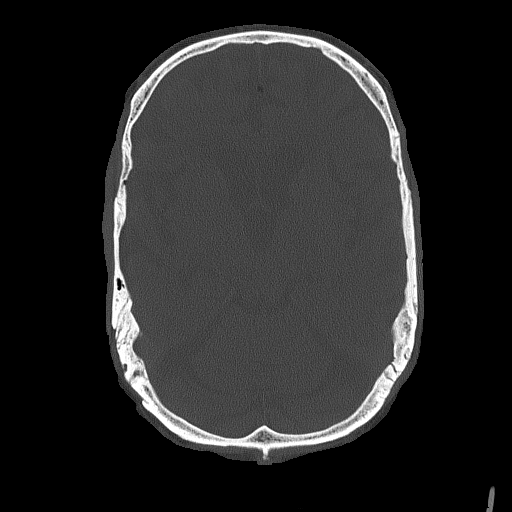
[im 32/74  bone]
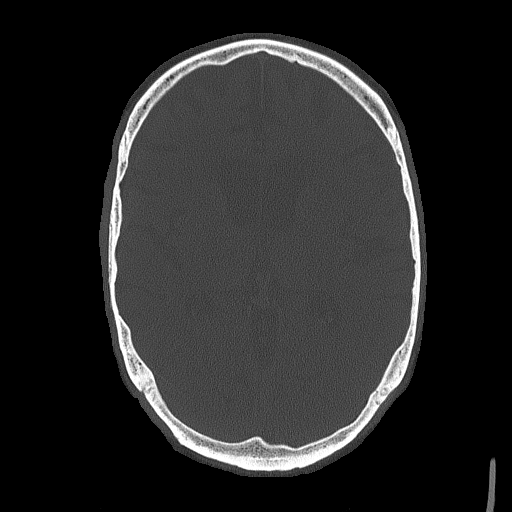

[Series 4: coronal soft tissue · coronal · 0.31mm/px · 3 of 66 slices shown]
[im 22/66  brain]
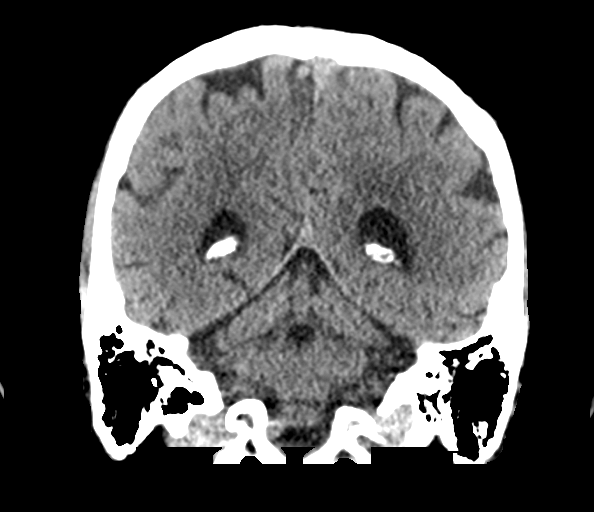
[im 29/66  brain]
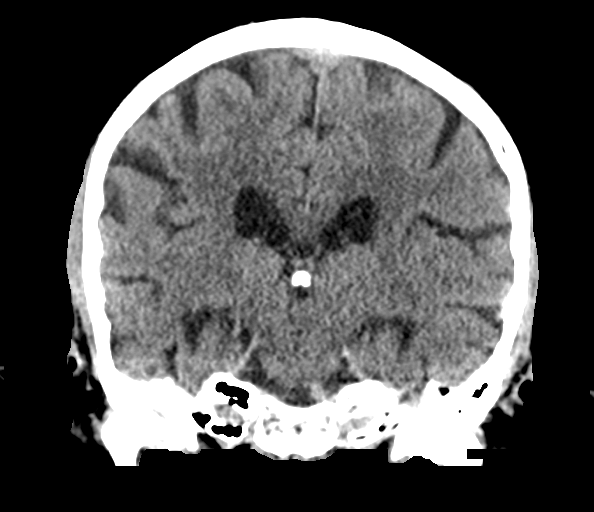
[im 37/66  brain]
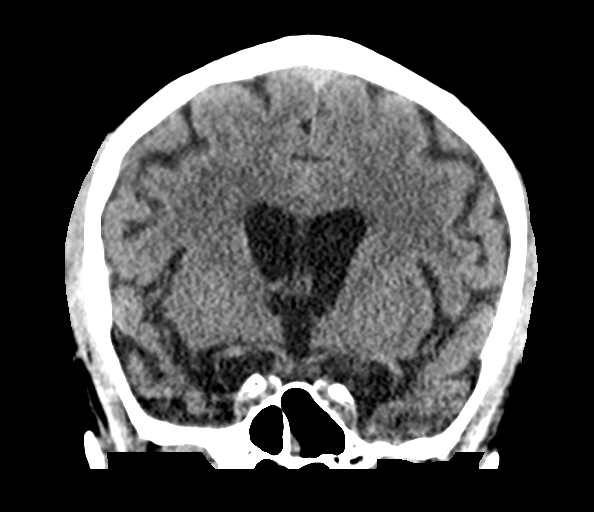

[Series 5: sagittal soft tissue · sagittal · 0.33mm/px · 3 of 50 slices shown]
[im 17/50  brain]
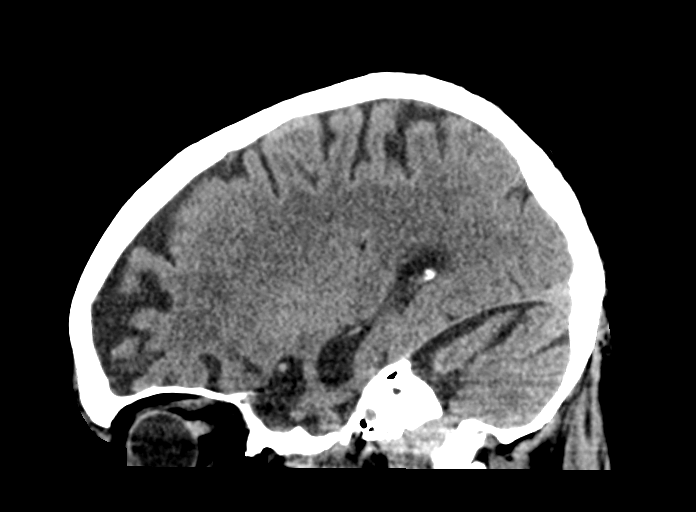
[im 25/50  brain]
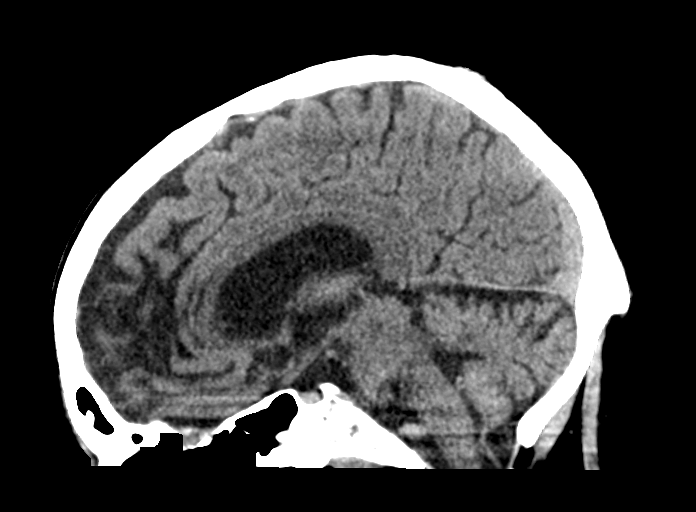
[im 33/50  brain]
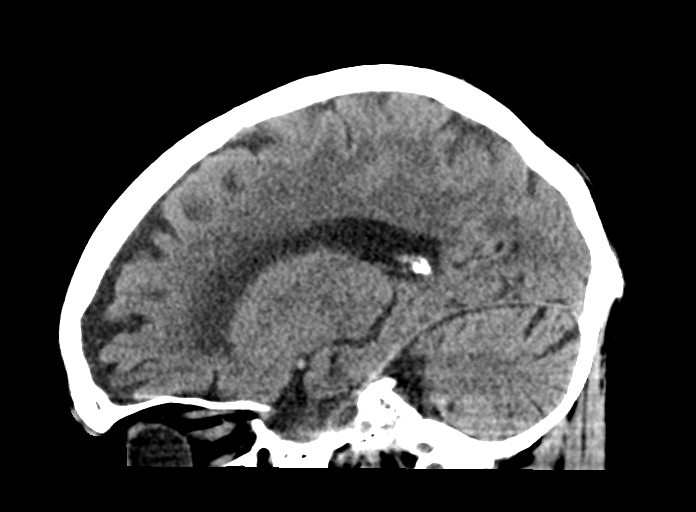

[16 of 47 positions shown; findings below may reference images not displayed]

FINDINGS: Brain: No evidence of acute infarction, hemorrhage, hydrocephalus,
extra-axial collection or mass lesion/mass effect. Atrophic changes
and chronic white matter ischemic changes commensurate with the
patient's given age are seen.

Vascular: No hyperdense vessel or unexpected calcification.

Skull: Normal. Negative for fracture or focal lesion.

Sinuses/Orbits: No acute finding.

Other: None.
IMPRESSION: Chronic atrophic and ischemic changes are noted. No acute
abnormality is seen.
